# Patient Record
Sex: Female | Born: 1975 | Race: White | Hispanic: No | Marital: Single | Smoking: Former smoker
Health system: Southern US, Community
[De-identification: ages and names within clinical notes are randomized; demographics above are authoritative.]

## PROBLEM LIST (undated history)

## (undated) DIAGNOSIS — C801 Malignant (primary) neoplasm, unspecified: Secondary | ICD-10-CM

## (undated) DIAGNOSIS — E8801 Alpha-1-antitrypsin deficiency: Secondary | ICD-10-CM

## (undated) DIAGNOSIS — M549 Dorsalgia, unspecified: Secondary | ICD-10-CM

## (undated) DIAGNOSIS — J449 Chronic obstructive pulmonary disease, unspecified: Secondary | ICD-10-CM

## (undated) HISTORY — PX: SHOULDER SURGERY: SHX246

## (undated) HISTORY — PX: TUBAL LIGATION: SHX77

## (undated) HISTORY — PX: LUNG BIOPSY: SHX232

---

## 1998-11-04 ENCOUNTER — Emergency Department (HOSPITAL_COMMUNITY): Admission: EM | Admit: 1998-11-04 | Discharge: 1998-11-04 | Payer: Self-pay | Admitting: Emergency Medicine

## 1999-01-24 ENCOUNTER — Encounter: Payer: Self-pay | Admitting: Thoracic Surgery (Cardiothoracic Vascular Surgery)

## 1999-01-24 ENCOUNTER — Inpatient Hospital Stay (HOSPITAL_COMMUNITY): Admission: EM | Admit: 1999-01-24 | Discharge: 1999-01-27 | Payer: Self-pay | Admitting: Emergency Medicine

## 1999-01-24 ENCOUNTER — Encounter: Payer: Self-pay | Admitting: Emergency Medicine

## 1999-01-25 ENCOUNTER — Encounter: Payer: Self-pay | Admitting: Thoracic Surgery (Cardiothoracic Vascular Surgery)

## 1999-01-27 ENCOUNTER — Encounter: Payer: Self-pay | Admitting: Thoracic Surgery (Cardiothoracic Vascular Surgery)

## 1999-05-07 ENCOUNTER — Emergency Department (HOSPITAL_COMMUNITY): Admission: EM | Admit: 1999-05-07 | Discharge: 1999-05-07 | Payer: Self-pay | Admitting: Emergency Medicine

## 1999-05-07 ENCOUNTER — Encounter: Payer: Self-pay | Admitting: Emergency Medicine

## 1999-08-28 ENCOUNTER — Inpatient Hospital Stay: Admission: EM | Admit: 1999-08-28 | Discharge: 1999-08-28 | Payer: Self-pay | Admitting: Obstetrics & Gynecology

## 2000-01-18 ENCOUNTER — Emergency Department (HOSPITAL_COMMUNITY): Admission: EM | Admit: 2000-01-18 | Discharge: 2000-01-18 | Payer: Self-pay | Admitting: Emergency Medicine

## 2000-01-18 ENCOUNTER — Encounter: Payer: Self-pay | Admitting: Emergency Medicine

## 2006-11-13 ENCOUNTER — Other Ambulatory Visit: Admission: RE | Admit: 2006-11-13 | Discharge: 2006-11-13 | Payer: Self-pay | Admitting: Obstetrics and Gynecology

## 2007-03-20 ENCOUNTER — Ambulatory Visit (HOSPITAL_COMMUNITY): Admission: RE | Admit: 2007-03-20 | Discharge: 2007-03-20 | Payer: Self-pay | Admitting: Obstetrics and Gynecology

## 2007-03-24 ENCOUNTER — Encounter (INDEPENDENT_AMBULATORY_CARE_PROVIDER_SITE_OTHER): Payer: Self-pay | Admitting: Obstetrics and Gynecology

## 2007-03-24 ENCOUNTER — Inpatient Hospital Stay (HOSPITAL_COMMUNITY): Admission: RE | Admit: 2007-03-24 | Discharge: 2007-03-27 | Payer: Self-pay | Admitting: Obstetrics and Gynecology

## 2007-05-05 ENCOUNTER — Other Ambulatory Visit: Admission: RE | Admit: 2007-05-05 | Discharge: 2007-05-05 | Payer: Self-pay | Admitting: Obstetrics and Gynecology

## 2008-10-15 ENCOUNTER — Encounter: Payer: Self-pay | Admitting: Critical Care Medicine

## 2008-10-18 DIAGNOSIS — J939 Pneumothorax, unspecified: Secondary | ICD-10-CM | POA: Insufficient documentation

## 2008-10-18 DIAGNOSIS — Z9889 Other specified postprocedural states: Secondary | ICD-10-CM | POA: Insufficient documentation

## 2008-10-18 DIAGNOSIS — J930 Spontaneous tension pneumothorax: Secondary | ICD-10-CM | POA: Insufficient documentation

## 2008-10-19 ENCOUNTER — Ambulatory Visit: Payer: Self-pay | Admitting: Critical Care Medicine

## 2008-10-19 ENCOUNTER — Encounter: Payer: Self-pay | Admitting: Critical Care Medicine

## 2008-10-20 ENCOUNTER — Encounter: Payer: Self-pay | Admitting: Obstetrics and Gynecology

## 2008-10-20 ENCOUNTER — Ambulatory Visit: Payer: Self-pay | Admitting: Thoracic Surgery (Cardiothoracic Vascular Surgery)

## 2008-10-20 ENCOUNTER — Encounter
Admission: RE | Admit: 2008-10-20 | Discharge: 2008-10-20 | Payer: Self-pay | Admitting: Thoracic Surgery (Cardiothoracic Vascular Surgery)

## 2008-10-21 ENCOUNTER — Inpatient Hospital Stay (HOSPITAL_COMMUNITY): Admission: EM | Admit: 2008-10-21 | Discharge: 2008-10-27 | Payer: Self-pay | Admitting: Emergency Medicine

## 2008-10-21 ENCOUNTER — Encounter: Payer: Self-pay | Admitting: Critical Care Medicine

## 2008-10-21 ENCOUNTER — Ambulatory Visit: Payer: Self-pay | Admitting: Internal Medicine

## 2008-10-21 ENCOUNTER — Ambulatory Visit: Payer: Self-pay | Admitting: Thoracic Surgery

## 2008-10-27 ENCOUNTER — Telehealth (INDEPENDENT_AMBULATORY_CARE_PROVIDER_SITE_OTHER): Payer: Self-pay | Admitting: *Deleted

## 2008-11-03 ENCOUNTER — Encounter: Admission: RE | Admit: 2008-11-03 | Discharge: 2008-11-03 | Payer: Self-pay | Admitting: Thoracic Surgery

## 2008-11-03 ENCOUNTER — Ambulatory Visit: Payer: Self-pay | Admitting: Thoracic Surgery

## 2008-11-23 ENCOUNTER — Ambulatory Visit: Payer: Self-pay | Admitting: Thoracic Surgery

## 2008-11-23 ENCOUNTER — Encounter: Payer: Self-pay | Admitting: Critical Care Medicine

## 2008-11-26 ENCOUNTER — Ambulatory Visit: Payer: Self-pay | Admitting: Critical Care Medicine

## 2008-12-09 ENCOUNTER — Telehealth (INDEPENDENT_AMBULATORY_CARE_PROVIDER_SITE_OTHER): Payer: Self-pay | Admitting: *Deleted

## 2009-01-07 ENCOUNTER — Telehealth: Payer: Self-pay | Admitting: Critical Care Medicine

## 2009-03-02 ENCOUNTER — Telehealth (INDEPENDENT_AMBULATORY_CARE_PROVIDER_SITE_OTHER): Payer: Self-pay | Admitting: *Deleted

## 2009-04-12 ENCOUNTER — Ambulatory Visit: Payer: Self-pay | Admitting: Critical Care Medicine

## 2009-05-02 ENCOUNTER — Inpatient Hospital Stay (HOSPITAL_COMMUNITY): Admission: RE | Admit: 2009-05-02 | Discharge: 2009-05-04 | Payer: Self-pay | Admitting: Obstetrics and Gynecology

## 2009-05-02 ENCOUNTER — Encounter (INDEPENDENT_AMBULATORY_CARE_PROVIDER_SITE_OTHER): Payer: Self-pay | Admitting: Obstetrics and Gynecology

## 2009-12-12 ENCOUNTER — Emergency Department (HOSPITAL_COMMUNITY): Admission: EM | Admit: 2009-12-12 | Discharge: 2009-12-12 | Payer: Self-pay | Admitting: Emergency Medicine

## 2009-12-22 ENCOUNTER — Encounter: Admission: RE | Admit: 2009-12-22 | Discharge: 2009-12-22 | Payer: Self-pay | Admitting: Unknown Physician Specialty

## 2009-12-24 ENCOUNTER — Emergency Department (HOSPITAL_COMMUNITY): Admission: EM | Admit: 2009-12-24 | Discharge: 2009-12-25 | Payer: Self-pay | Admitting: Emergency Medicine

## 2009-12-28 ENCOUNTER — Encounter: Payer: Self-pay | Admitting: Adult Health

## 2009-12-28 ENCOUNTER — Ambulatory Visit: Payer: Self-pay | Admitting: Internal Medicine

## 2009-12-28 ENCOUNTER — Ambulatory Visit: Payer: Self-pay | Admitting: Critical Care Medicine

## 2010-01-06 ENCOUNTER — Ambulatory Visit: Payer: Self-pay | Admitting: Thoracic Surgery

## 2010-01-06 ENCOUNTER — Encounter: Admission: RE | Admit: 2010-01-06 | Discharge: 2010-01-06 | Payer: Self-pay | Admitting: Thoracic Surgery

## 2010-02-10 ENCOUNTER — Encounter: Admission: RE | Admit: 2010-02-10 | Discharge: 2010-02-10 | Payer: Self-pay | Admitting: Unknown Physician Specialty

## 2010-03-21 ENCOUNTER — Encounter: Admission: RE | Admit: 2010-03-21 | Discharge: 2010-03-21 | Payer: Self-pay | Admitting: Unknown Physician Specialty

## 2010-03-24 ENCOUNTER — Encounter: Admission: RE | Admit: 2010-03-24 | Discharge: 2010-03-24 | Payer: Self-pay | Admitting: Unknown Physician Specialty

## 2010-07-29 IMAGING — CR DG CHEST 2V
2 series · 2 of 2 positions shown · non-contrast
Comparison: 02/10/2010

CLINICAL DATA: Cough and shortness of breath.

CHEST - 2 VIEW

[view not recorded (1 of 2)]
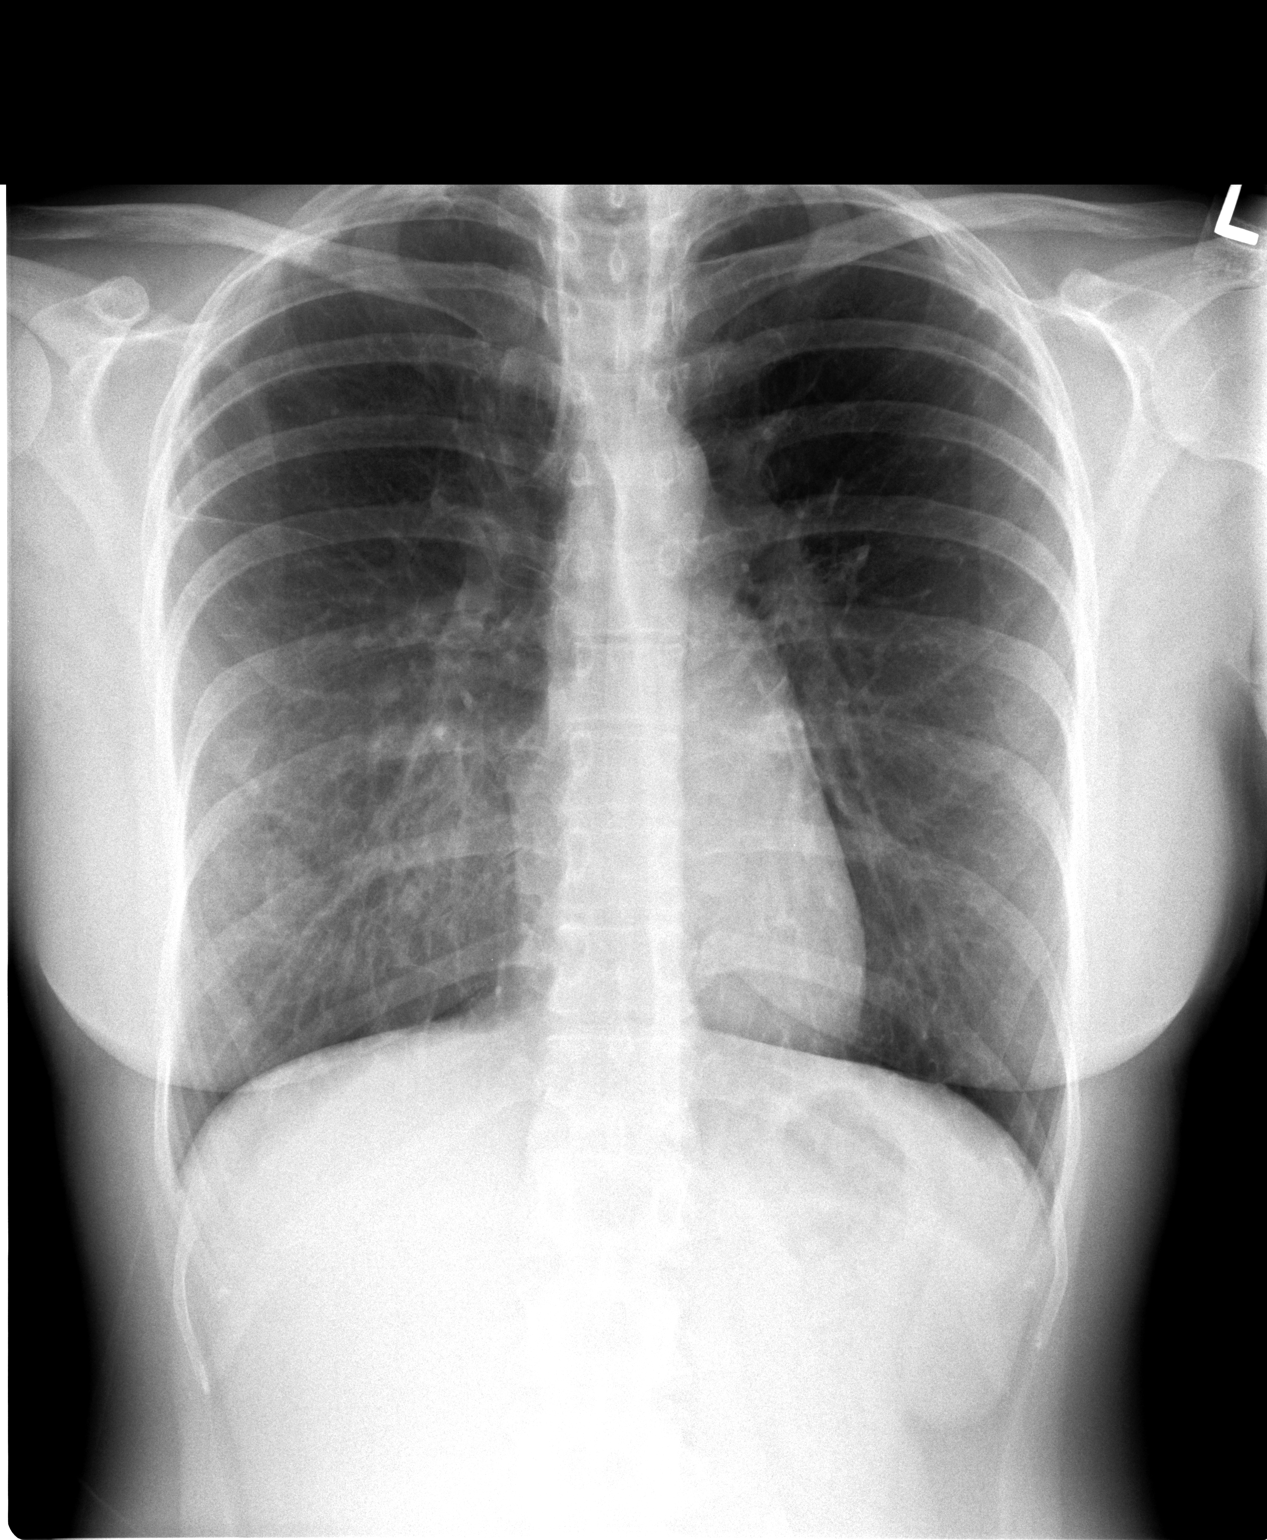

[view not recorded (2 of 2)]
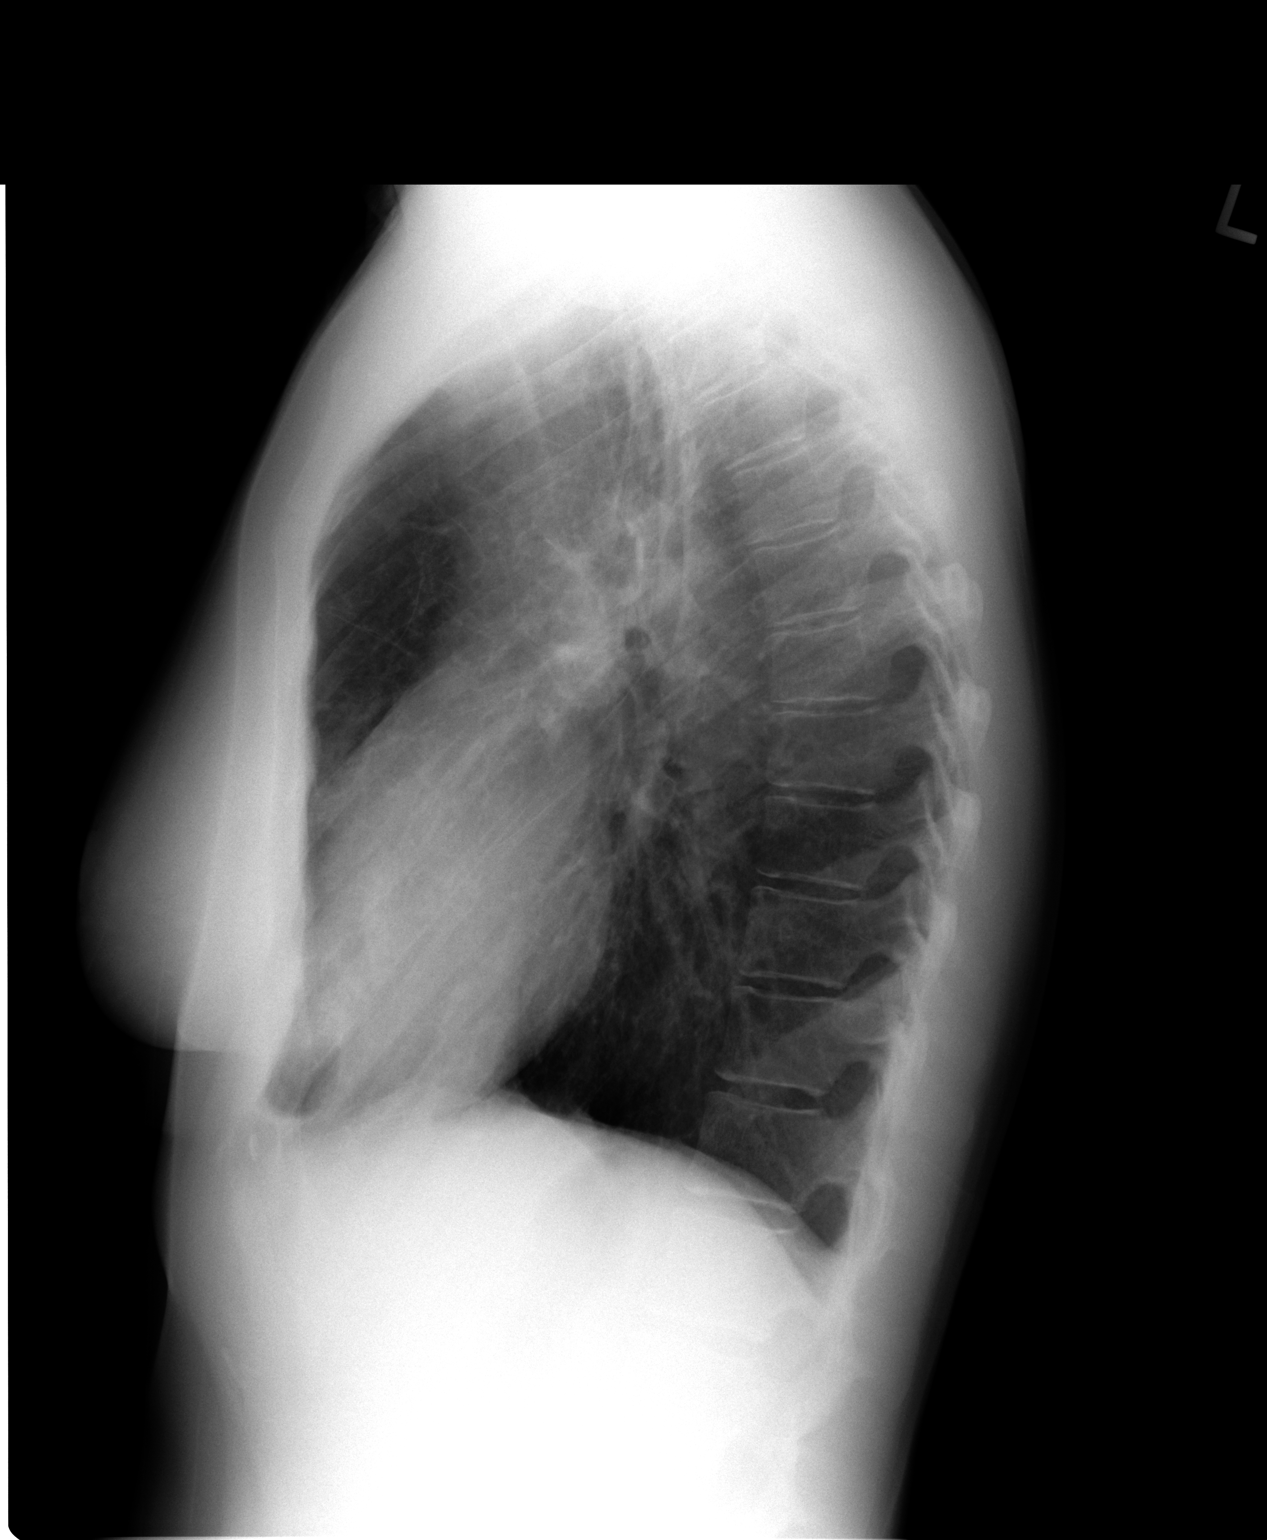

[2 of 2 positions shown; findings below may reference images not displayed]

FINDINGS: The cardiomediastinal silhouette is unremarkable.
Mild increased ill-defined opacity within the right middle lobe is
suspicious for early pneumonia.
Emphysema/COPD and mild biapical pleuroparenchymal scarring again
noted.
There is no evidence of pleural effusion or pneumothorax.
No acute bony abnormalities are identified.
IMPRESSION: Probable early right middle lobe pneumonia.

COPD/emphysema.

## 2010-10-15 ENCOUNTER — Encounter: Payer: Self-pay | Admitting: Thoracic Surgery (Cardiothoracic Vascular Surgery)

## 2010-10-15 ENCOUNTER — Encounter: Payer: Self-pay | Admitting: Obstetrics and Gynecology

## 2010-10-24 NOTE — Assessment & Plan Note (Signed)
Summary: hfu/ mbw   Copy to:  Dr. Joycelyn Rua Primary Provider/Referring Provider:  none  CC:  f/u after er visit at [facility] on 12-24-09 - pain in left chest  and shoulder and upper left back - Pt reports that cxr showed partial collapsed lung - occas SOB.  History of Present Illness: 35 years old female with Hx of spontaneous PTX.  April 12, 2009--no changes made at last ovNow in 3rd trimester of pregnancy.No issues with the baby.  SMokes about 2 cig/d.No dyspnea or current resp complaints.  12/28/09-Presents for ER follow up. Pt began having intermittent left upper chest wall pain on 3/31 -she was seen at PCP w/ xray that showed a small Pnuemothorax in left apex.. Her pain  worsened prompting her to go to ER on 4/2 , xray showed just slightly increased pneumo. She is unchanged today w/ left upper chest wall pain. She was given vicodin for pain and written out of work. She does continue to smoke. She denies increased dyspnea, cough. Denies orthopnea, hemoptysis, fever, n/v/d, edema, headache,recent travel, trauma.     Current Medications (verified): 1)  Tylenol Extra Strength 500 Mg Tabs (Acetaminophen) .... 2 By Mouth Daily As Needed Pain 2)  Vicodin 5-500 Mg Tabs (Hydrocodone-Acetaminophen) .... Take One By Mouth Every 6 Hours As Needed Pain 3)  Prednisone 10 Mg Tabs (Prednisone) .... 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,  now recurrent       -20% PTX 2010, left sided 12/2009 CRYOTHERAPY, CERVIX, HX OF (ICD-V45.89)  Review of Systems      See HPI  Vital Signs:  Patient profile:   35 year old female Weight:      120.50 pounds O2 Sat:      100 % on Room air Temp:     97.5 degrees F oral Pulse rate:   106 / minute BP sitting:   112 / 60  (left arm) Cuff size:   regular  Vitals Entered By: Abigail Miyamoto RN (December 28, 2009 12:00 PM)  O2 Flow:  Room air  Physical Exam  Additional Exam:  Gen: Pleasant, well nourished, in no distress ENT: no lesions, no postnasal drip Neck: No JVD, no TMG, no carotid bruits Lungs: no use of accessory muscles, no dullness to percussion, clear without rales or rhonchi Cardiovascular: RRR, heart sounds normal, no murmurs or gallops, no peripheral edema Musculoskeletal: No deformities, no cyanosis or clubbing     CXR  Procedure date:  12/28/2009  Findings:      Findings: Only a very small left pneumothorax is noted, now less than 5%.   There is chronic apical pleural thickening and mild elevation of the minor fissure.  No active cardiopulmonary disease.   IMPRESSION:   1.  Left apical pneumothorax now less than 5%. 2.  Chronic lung changes as above.  No acute findings are noted at this time.  Impression & Recommendations:  Problem # 1:  PNEUMOTHORAX (ICD-512.8) Left sided Pnumothorax xray reviewed on imagecast w/ noted decreased  size  ~<5%  Case discussed with Dr. Delford Field  She will be referred to Dr. Edwyna Shell for reevaluation of recurrent pnuemothorax.  once again advised on smoking cesstation.   Orders: T-2 View CXR (71020TC) Surgical Referral (Surgery) Est. Patient Level IV (60454)  Medications Added to Medication List This Visit: 1)  Vicodin 5-500 Mg Tabs (Hydrocodone-acetaminophen) .... Take one by mouth every 6 hours as needed pain 2)  Prednisone 10 Mg Tabs (Prednisone) .... Taper as directed  Complete Medication List: 1)  Tylenol Extra Strength 500 Mg Tabs  (Acetaminophen) .... 2 by mouth daily as needed pain 2)  Vicodin 5-500 Mg Tabs (Hydrocodone-acetaminophen) .... Take one by mouth every 6 hours as needed pain 3)  Prednisone 10 Mg Tabs (Prednisone) .... Taper as directed  Patient Instructions: 1)  MOST IMPORTANT IS TO QUIT SMOKING  2)  We are referring you to Dr. Edwyna Shell for evaluation of your recurrent pneumothorax.  3)  No heavy lifting or exercise until seen at Dr. Edwyna Shell office.  4)  Please contact office for sooner follow up if symptoms do not improve or worsen  5)  Follow up Dr Delford Field in 1 month

## 2010-10-24 NOTE — Letter (Signed)
Summary: Out of Work  [employer]  [address]   [place], [place] [address]   Phone: [phone]  Fax: [phone]    December 28, 2009   Employee:  [name]    To Whom It May Concern:   For Medical reasons, please excuse the above named employee from work for the following dates:  Start:   Thursday December 29, 2009  End:   may return to work Monday January 02, 2010  If you need additional information, please feel free to contact our office.         Sincerely,         Tammy Parrett, N.P.

## 2010-12-13 LAB — URINALYSIS, ROUTINE W REFLEX MICROSCOPIC
Bilirubin Urine: NEGATIVE
Hgb urine dipstick: NEGATIVE
Nitrite: NEGATIVE
Protein, ur: NEGATIVE mg/dL
Urobilinogen, UA: 0.2 mg/dL (ref 0.0–1.0)

## 2010-12-13 LAB — COMPREHENSIVE METABOLIC PANEL
ALT: 21 U/L (ref 0–35)
Alkaline Phosphatase: 77 U/L (ref 39–117)
BUN: 9 mg/dL (ref 6–23)
Chloride: 102 mEq/L (ref 96–112)
Glucose, Bld: 91 mg/dL (ref 70–99)
Potassium: 5 mEq/L (ref 3.5–5.1)
Sodium: 136 mEq/L (ref 135–145)
Total Bilirubin: 1.1 mg/dL (ref 0.3–1.2)
Total Protein: 7.6 g/dL (ref 6.0–8.3)

## 2010-12-13 LAB — CBC
HCT: 40.2 % (ref 36.0–46.0)
Hemoglobin: 13.5 g/dL (ref 12.0–15.0)
RBC: 4.48 MIL/uL (ref 3.87–5.11)
RDW: 12.9 % (ref 11.5–15.5)

## 2010-12-13 LAB — DIFFERENTIAL
Basophils Absolute: 0.1 10*3/uL (ref 0.0–0.1)
Basophils Relative: 1 % (ref 0–1)
Eosinophils Absolute: 0.4 10*3/uL (ref 0.0–0.7)
Monocytes Absolute: 1.2 10*3/uL — ABNORMAL HIGH (ref 0.1–1.0)
Monocytes Relative: 8 % (ref 3–12)
Neutro Abs: 7.2 10*3/uL (ref 1.7–7.7)
Neutrophils Relative %: 50 % (ref 43–77)

## 2010-12-13 LAB — URINE CULTURE

## 2010-12-13 LAB — RAPID URINE DRUG SCREEN, HOSP PERFORMED
Barbiturates: NOT DETECTED
Opiates: NOT DETECTED
Tetrahydrocannabinol: NOT DETECTED

## 2010-12-18 LAB — POCT I-STAT, CHEM 8
BUN: 13 mg/dL (ref 6–23)
Chloride: 105 mEq/L (ref 96–112)
Creatinine, Ser: 0.9 mg/dL (ref 0.4–1.2)
Glucose, Bld: 104 mg/dL — ABNORMAL HIGH (ref 70–99)
Potassium: 3.3 mEq/L — ABNORMAL LOW (ref 3.5–5.1)

## 2010-12-18 LAB — HEPATIC FUNCTION PANEL
ALT: 16 U/L (ref 0–35)
Albumin: 4.5 g/dL (ref 3.5–5.2)
Alkaline Phosphatase: 79 U/L (ref 39–117)
Indirect Bilirubin: 0.3 mg/dL (ref 0.3–0.9)
Total Protein: 8.7 g/dL — ABNORMAL HIGH (ref 6.0–8.3)

## 2010-12-18 LAB — DIFFERENTIAL
Eosinophils Relative: 1 % (ref 0–5)
Lymphocytes Relative: 14 % (ref 12–46)
Lymphs Abs: 1.9 10*3/uL (ref 0.7–4.0)

## 2010-12-18 LAB — CBC
HCT: 41.3 % (ref 36.0–46.0)
Hemoglobin: 14 g/dL (ref 12.0–15.0)
RDW: 12.8 % (ref 11.5–15.5)

## 2010-12-18 LAB — CK TOTAL AND CKMB (NOT AT ARMC): CK, MB: 1.3 ng/mL (ref 0.3–4.0)

## 2010-12-30 LAB — CBC
HCT: 35.5 % — ABNORMAL LOW (ref 36.0–46.0)
Hemoglobin: 10.1 g/dL — ABNORMAL LOW (ref 12.0–15.0)
MCHC: 35.2 g/dL (ref 30.0–36.0)
MCV: 93.4 fL (ref 78.0–100.0)
MCV: 94 fL (ref 78.0–100.0)
Platelets: 276 10*3/uL (ref 150–400)
RDW: 14.3 % (ref 11.5–15.5)
RDW: 14.3 % (ref 11.5–15.5)

## 2010-12-30 LAB — BASIC METABOLIC PANEL
BUN: 7 mg/dL (ref 6–23)
CO2: 21 mEq/L (ref 19–32)
Chloride: 104 mEq/L (ref 96–112)
Glucose, Bld: 90 mg/dL (ref 70–99)
Potassium: 4 mEq/L (ref 3.5–5.1)

## 2011-01-08 LAB — URINE CULTURE
Colony Count: NO GROWTH
Culture: NO GROWTH

## 2011-01-08 LAB — CBC
HCT: 33.7 % — ABNORMAL LOW (ref 36.0–46.0)
Hemoglobin: 11.6 g/dL — ABNORMAL LOW (ref 12.0–15.0)
MCV: 90.9 fL (ref 78.0–100.0)
RBC: 3.45 MIL/uL — ABNORMAL LOW (ref 3.87–5.11)
RBC: 3.7 MIL/uL — ABNORMAL LOW (ref 3.87–5.11)
RDW: 12.5 % (ref 11.5–15.5)
WBC: 8.1 10*3/uL (ref 4.0–10.5)

## 2011-01-08 LAB — LIPID PANEL
Triglycerides: 105 mg/dL (ref <150)
VLDL: 21 mg/dL (ref 0–40)

## 2011-01-08 LAB — DRUGS OF ABUSE SCREEN W/O ALC, ROUTINE URINE
Cocaine Metabolites: NEGATIVE
Creatinine,U: 47.3 mg/dL
Marijuana Metabolite: NEGATIVE
Methadone: NEGATIVE
Opiate Screen, Urine: NEGATIVE
Propoxyphene: NEGATIVE

## 2011-01-08 LAB — URINALYSIS, ROUTINE W REFLEX MICROSCOPIC
Bilirubin Urine: NEGATIVE
Glucose, UA: NEGATIVE mg/dL
Hgb urine dipstick: NEGATIVE
Ketones, ur: NEGATIVE mg/dL
Nitrite: NEGATIVE
Protein, ur: NEGATIVE mg/dL
Protein, ur: NEGATIVE mg/dL
Specific Gravity, Urine: 1.011 (ref 1.005–1.030)
Urobilinogen, UA: 0.2 mg/dL (ref 0.0–1.0)

## 2011-01-08 LAB — COMPREHENSIVE METABOLIC PANEL
ALT: 14 U/L (ref 0–35)
Alkaline Phosphatase: 88 U/L (ref 39–117)
BUN: 5 mg/dL — ABNORMAL LOW (ref 6–23)
CO2: 23 mEq/L (ref 19–32)
Chloride: 106 mEq/L (ref 96–112)
GFR calc non Af Amer: >60 mL/min (ref >60)
Glucose, Bld: 121 mg/dL — ABNORMAL HIGH (ref 70–99)
Potassium: 4.1 mEq/L (ref 3.5–5.1)
Sodium: 135 mEq/L (ref 135–145)
Total Bilirubin: 0.3 mg/dL (ref 0.3–1.2)
Total Protein: 6.3 g/dL (ref 6.0–8.3)

## 2011-01-08 LAB — HCG, QUANTITATIVE, PREGNANCY: hCG, Beta Chain, Quant, S: 51276 m[IU]/mL — ABNORMAL HIGH (ref <5)

## 2011-01-08 LAB — BASIC METABOLIC PANEL
Chloride: 106 mEq/L (ref 96–112)
Creatinine, Ser: 0.57 mg/dL (ref 0.4–1.2)
GFR calc Af Amer: >60 mL/min (ref >60)
Potassium: 3.9 mEq/L (ref 3.5–5.1)

## 2011-01-08 LAB — PROTIME-INR
INR: 1 (ref 0.00–1.49)
Prothrombin Time: 13.2 seconds (ref 11.6–15.2)

## 2011-01-08 LAB — TROPONIN I: Troponin I: <0.01 ng/mL (ref 0.00–0.06)

## 2011-01-08 LAB — POCT PREGNANCY, URINE: Preg Test, Ur: POSITIVE

## 2011-02-06 NOTE — Consult Note (Signed)
NEW PATIENT CONSULTATION   [name], [name]  DOB:  Feb 10, 1976                                        October 20, 2008  CHART #:  [id number]   REASON FOR CONSULTATION:  Left recurrent pneumothorax.   HISTORY OF PRESENT ILLNESS:  The patient is Owen 35 year old woman with the  history of tobacco abuse.  She is still smoking one pack daily.  She has  Owen history of Owen spontaneous pneumothorax in 2000.  Approximately Owen week  and half ago, the patient developed left upper chest pain.  Since she  has had Owen GI virus prior to that, but no other antecedent cause of the  pain.  She described this at first she thought she had Owen pulled muscle  in her shoulder and she just went along with this for Owen few days.  She  did have Owen little bit of shortness of breath initially, but was not  severe.  She then noted over the next few days that she was having more  shortness of breath and more pain and the pain just would not go away.  She subsequently went to the place where my sister works and had an x-  ray, which showed Owen left pneumothorax.  She was referred to Dr. Shan Levans who saw her yesterday.  Chest x-ray from yesterday showed Owen  superior and lateral pneumothorax.  There also was Owen basilar component  with air-fluid level at the base.  The majority of the one laterally was  to the chest wall.  On the lateral film, there does appear to be an  anterior component to the pneumothorax.  The patient was not terribly  symptomatic and she was advised to schedule to see me in the office  today with Owen repeat chest x-ray.  The patient went to [facility] for chest x-  ray and informed them that she was pregnant.  They refused to do the  film.  She was sent to the office.  She was sent back down after we  discussed with him repeating the x-ray with double shielding.  Apparently, there was some confusion and she was once again refused and  came back to the office.  The third attempt to get an  x-ray, the patient  refused to go down for the film.  She said that she is still having some  discomfort.  She does have Owen little shortness of breath with exertion,  but not at rest.  She states she has been primarily bothered because she  is having difficulty sleeping at night.   PAST MEDICAL HISTORY:  Significant for left spontaneous pneumothorax in  2000.  She had cryotherapy of her cervix.  She had C-sections in 2002  and 2008.   MEDICATIONS:  She is not currently on any medications other than Tylenol  p.r.n.   ALLERGIES:  She has no known drug allergies.   FAMILY HISTORY:  Significant for spontaneous pneumothorax in her mother.  She did not have Owen recurrence.   SOCIAL HISTORY:  She is single.  She has 1 child.  She is accompanied by  Owen female friend today.  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  She does smoke 1 pack per  day.   REVIEW OF SYSTEMS:  Negative other than what is noted in the HPI.  PHYSICAL EXAMINATION:  GENERAL:  The patient is Owen 35 year old female, in  no acute distress.  VITAL SIGNS:  Blood pressure is 118/66, pulse is 92, and respirations  16.  Her ox saturation is 99% on room air.  HEENT:  Unremarkable.  Trachea is midline.  CHEST:  There is no subcutaneous emphysema.  Breath sounds are  essentially equal bilaterally.  CARDIAC:  Regular rate and rhythm.  Normal S1 and S2.   Owen chest x-ray from yesterday is reviewed, it shows Owen small-to-moderate  size left pneumothorax, it maybe Owen more anterior component which is  difficult to discern, but the majority of the lung appears stuck to the  chest wall.  There was no subcutaneous emphysema.  There was no  mediastinal shift.   IMPRESSION:  The patient is Owen 35 year old woman with Owen recurrent  spontaneous pneumothorax.  We discussed management of pneumothorax, once  again reviewing that when she had her first pneumothorax, she was  approximately Owen 20% chance of recurrence, although with her continuing  to smoke, it was in  reality higher than that, but at this point, there  is Owen 50-50 chance of recurrence.  Importantly at this point in time, she  is minimally symptomatic and does not need chest tube placement.  The  difficulty in her situation arises with her pregnancy.  She states that  she has had Owen positive pregnancy test.  She has not seen Owen doctor to  have this dated.  But it would have to be assumed, since this is Owen  recent development, that this is likely Owen first trimester.  Apparently,  she has only missed one cycle.  The ideal approach at this point would  be to do Owen CT scan to assess the pulmonary anatomy and then proceed with  video-assisted thoracoscopic surgery blebectomy to prevent recurrence.  However, particularly with it being her first trimester pregnancy, I  would not recommend either Owen CT scan or surgical intervention for  nonemergent problems.  I did discuss with her that if she were to get  acutely short of breath, then she should go to the emergency room and if  need be Owen chest tube could be placed for short-term relief of the  problem.  I did have Owen long discussion with her that she should  discontinue smoking both in terms of risk of recurrent pneumothorax and  in regards to her early stage pregnancy.   The patient was unhappy with my opinion.  She did not agree with regards  to the risk of termination or birth defects with elective surgery during  the first trimester and discussed the alternative treatment plan, she  became upset, turned her back, and essentially would not continue to  discuss with me.  Her female friend had questions regarding what would  happen if she needed emergency surgery and of course, I discussed that  if she did need an emergency operation that would be carried out with  extremely high risk for complications related to her pregnancy, but this  is not an emergent situation.  If she were to develop acute shortness of  breath to the point that there was an  emergency to treat pneumothorax,  it can be treated under local anesthesia with Owen chest tube and with  definitive management done electively postpregnancy.  I recommended to  them that we check another film on her early next week.  She said that  she would elect not to come back next  week.  I then offered 2 weeks, she  said that she would not come back to that time either.  I did advise her  that she should follow up either with Dr. Delford Field or her medical doctor.  Her indication was that she would refuse to do either one of those as  well.  I did tell her that I could help arrange for Owen second opinion if  she so desired.  She did not wish for [place] to do that this time.  At the  patient's insistence, we will not plan followup despite my advice to her  that we should.   Salvatore Decent Dorris Fetch, M.D.  Electronically Signed   SCH/MEDQ  D:  10/20/2008  T:  10/20/2008  Job:  [id number]   cc:   Charlcie Cradle. Delford Field, MD, [facility]  Joycelyn Rua, M.D.

## 2011-02-06 NOTE — H&P (Signed)
[name], [name]           ACCOUNT NO.:  [id number]   MEDICAL RECORD NO.:  [id number]          PATIENT TYPE:  INP   LOCATION:  5158                         FACILITY:  [facility]   PHYSICIAN:  Carlena Hurl, MDDATE OF BIRTH:  06-27-1976   DATE OF ADMISSION:  10/21/2008  DATE OF DISCHARGE:                              HISTORY & PHYSICAL   CHIEF COMPLAINT:  Worsening shortness of breath and was transferred to  the ER because of the chest x-ray showing a right pneumothorax.   HISTORY OF PRESENT ILLNESS:  This is a 35 year old Caucasian female who  has a past medical history significant for spontaneous pneumothorax  happened in 2000, was transferred to the ER because of x-ray showing a  right pneumothorax.  The history was obtained from the patient.  According to her, she has a history of spontaneous pneumothorax, the  first episode happened in 2000.  She follows up with Dr. Charlett Lango as well as Dr. Delford Field.  She was seen by Dr. Dorris Fetch on  October 20, 2008, for the similar problem, and there she was offered  video-assisted thoracoscopic surgery lobectomy to prevent the  recurrence, and the patient refused to get any of the test done  according to the note from the Dr. Dorris Fetch.  On the next day, the  patient went to [facility] where she had chest x-ray again, which  showed new large right pneumothorax with complete collapse of the right  lung.  Because of that the patient was sent to this ER from the [facility].  When she came to the ER, a chest tube was placed and the  patient's shortness of breath gradually improved.  Right now, the  patient has a chest tube placed and her breathing improved, but she has  severe pain where the chest tube is placed.   PAST MEDICAL HISTORY:  Significant for spontaneous pneumothorax.   PAST SURGICAL HISTORY:  She had C-sections in 2002, 2008, and  cryotherapy of her cervix.   ALLERGIES:  No known drug  allergies.   MEDICATIONS:  The patient is not taking any medications.   FAMILY HISTORY:  Significant for spontaneous pneumothorax in her mother,  but she did not have any recurrence.   SOCIAL HISTORY:  The patient is single.  She has 1 child, and she works  as a Hospital doctor.  The patient is a smoker and still smokes about 1 per pack  per day.   REVIEW OF SYSTEMS:  Same as in HPI that is shortness of breath and right  shoulder pain.   PHYSICAL EXAMINATION:  GENERAL:  This is a 35 year old female who is  lying on the bed with discomfort of having a chest tube on the right  side, but no severe shortness of breath.  VITAL SIGNS:  When she came into the ER her blood pressure 108/67, pulse  rate of 101, respirations 20, temperature 98.6, and saturating 100% on  100% nonrebreather.  HEENT:  Head is atraumatic and normocephalic.  PERRLA.  Tympanic  membrane intact.  No discharge from eyes or ears.  NECK:  Supple.  No JVD appreciated.  LUNGS:  There is decreased air entry bilaterally, little more decreased  on the right side.  SKIN:  Crepitations are heard because of the chest tube placed on the  right side.  CVS:  S1 and S2 heard with regular rate and rhythm.  ABDOMEN:  Soft.  Bowel sounds present.  Nontender and nondistended.  EXTREMITIES:  No pedal edema noted.  Pulses palpable bilaterally.  No  cyanosis.  No clubbing noted.  CNS:  Awake, alert, and oriented x3.  No focal neurological deficits  noted.   LABORATORY DATA:  Urinalysis is pretty negative, and the patient has a  chest x-ray that was today around 12:45, which showed reexpansion of the  right lung after the chest tube was placed, but the left pneumothorax  15% is still unchanged.   ASSESSMENT AND PLAN:  This is a 35 year old Caucasian lady with a  significant history of spontaneous pneumothorax in 2000 and significant  family history in her mother of the same problem that is spontaneous  pneumothorax.  Now coming in with  total right lung collapse.  1. Recurrent spontaneous pneumothorax.  According to her, she has some      workup done as an outpatient and the patient is advised not to      smoke, and she was strongly advised about the side effects of      smoking as it could worsen her pneumothorax.  The patient is status      post chest tube at this time and once the patient's pneumothorax      completely resolves, we will remove chest tube and we will get the      old records to check for further etiology of her pneumothorax.  2. Pregnancy.  The patient says that she will be 10 weeks in about 3-4      days and the patient is advised side effects of smoking to the baby      as well.  3. Deep vein thrombosis prophylaxis.  The patient will be placed on      Lovenox 40 mg subcu 1 time a day.      Carlena Hurl, MD  Electronically Signed     JD/MEDQ  D:  10/21/2008  T:  10/21/2008  Job:  [id number]

## 2011-02-06 NOTE — Discharge Summary (Signed)
[name], [name]           ACCOUNT NO.:  [id number]   MEDICAL RECORD NO.:  [id number]          PATIENT TYPE:  INP   LOCATION:  9130                          [facility]ACILITY:  [facility]   PHYSICIAN:  Sherron Monday, MD        DATE OF BIRTH:  18-Sep-1976   DATE OF ADMISSION:  05/02/2009  DATE OF DISCHARGE:  05/04/2009                               DISCHARGE SUMMARY   ADMITTING DIAGNOSIS:  Intrauterine pregnancy at term with history of low  transverse cesarean section x2, undesired fertility.   DISCHARGE DIAGNOSES:  Intrauterine pregnancy at term with history of low  transverse cesarean section x2, undesired fertility, delivered.   PROCEDURE:  Status post repeat low transverse cesarean section as well  as bilateral tubal ligation.   For the complete history, please see the dictated note.  However, in  brief, 35 year old G4, P3 at 65 plus weeks for repeat low transverse  cesarean section and tubal ligation.  Her pregnancy was uncomplicated.  She does, however, have a history of spontaneous pneumothorax x2.  Her  cesarean section was performed on May 02, 2009, without difficulty.  She delivered a viable female infant at 9:47 a.m. with Apgars of 9 at 1  minute and 10 at 5 minutes and weight of 6 pounds 12 ounces.  Normal  uterus, tubes, and ovaries were noted.  Bilateral tubal segments were  sent to Pathology with EBL of approximately 500 mL.  Her postoperative  course was relatively uncomplicated.  She remained afebrile.  Vital  signs stable throughout.  On day of discharge, she was tolerating diet,  had passed gas, and voiding without difficulty, having normal lochia,  and pain was well controlled.  She desired discharge to home at this  time.  She was given routine discharge instructions and numbers to call  if any questions or problems.  She was also given prescriptions for  Motrin, Vicodin, and prenatal vitamins.  She will return to the office  with the baby's weight taken 1-2 days to get  her staples removed as  well.  She is breast-feeding.  Blood type is A positive.  She is rubella  immune.  She will use her tubal ligation for contraception and her  hemoglobin decreased from 12.4-10.1.      Sherron Monday, MD  Electronically Signed     JB/MEDQ  D:  05/04/2009  T:  05/04/2009  Job:  [id number]

## 2011-02-06 NOTE — H&P (Signed)
[name], [name] NO.:  [id number]   MEDICAL RECORD NO.:  [id number]          PATIENT TYPE:  INP   LOCATION:  9199                          FACILITY:  [facility]   PHYSICIAN:  Hannah Monday, MD        DATE OF BIRTH:  11-08-1975   DATE OF ADMISSION:  05/02/2009  DATE OF DISCHARGE:                              HISTORY & PHYSICAL   ADMISSION DIAGNOSES:  Intrauterine pregnancy at term with history of low  transverse cesarean section x2, undesired fertility.   PROCEDURE PLANNED:  Repeat low transverse cesarean section as well as  bilateral tubal ligation.   HISTORY OF PRESENT ILLNESS:  A 35 year old, G4, P3-0-0-3, at 25 plus  weeks for repeat low transverse cesarean section and tubal ligation.  Her pregnancy has been relatively uncomplicated except for a history of  spontaneous pneumothorax x2.  This was discussed at length with her  pulmonary medicine doctor and said that in her pregnancy she needed no  special care.  She was also some a late entry to care.   Past medical history is significant for pneumothorax x2 as well as  pulmonary blebs.   Past surgical history is significant for 2 low transverse cesarean  sections.   PAST GYNECOLOGIC HISTORY:  G1 was in 1995, 6-pound 9-ounce female infant,  vaginal delivery; G2 was in 2002, 5-pound 14-ounce female infant, C-  section secondary to failure to progress; G3 was in 2008, female infant 7  pounds, was a scheduled repeat cesarean section; G4 is the present  pregnancy.  She has a history of an abnormal Pap smear and a cryotherapy  in approximately 1997.   Medications include prenatal vitamins.   She has no known drug allergies.   SOCIAL HISTORY:  The patient admits to smoking.  No alcohol or drug use  in the pregnancy.   Family history is significant for her mother with hypertension,  grandfather with lung cancer, mother with diabetes.   PRENATAL LABORATORY DATA:  Hemoglobin 11.2, platelets 370,000.  RPR  nonreactive, hepatitis B surface antigen negative, rubella immune, HIV  negative.  Pap smear within normal limits.  She is A positive, antibody  screen negative, gonorrhea negative, chlamydia negative.  Cystic  fibrosis screen negative.  She was to advance to get prenatal diagnostic  screening.  One-hour Glucola of 102.  Group B strep was positive.  She  had an ultrasound at 19 weeks.  To date, her pregnancy with an [facility] of  May 07, 2009.  This ultrasound revealed normal anatomy, anterior  placenta, and a female infant.   PHYSICAL EXAMINATION:  VITAL SIGNS:  She is afebrile.  Vital signs  stable.  GENERAL:  No apparent distress.  CARDIOVASCULAR:  Regular rate and rhythm.  LUNGS:  Clear to auscultation bilaterally.  ABDOMEN:  Soft.  Fundus nontender.  EXTREMITIES:  Symmetric and nontender.  Fetal heart tones are 150s in  the office.  CERVIX:  She is 2 cm dilated, 70% effaced, and 2- station.   ASSESSMENT AND PLAN:  A 35 year old, G4, P3-0-0-3, at 39 plus weeks for  repeat low transverse cesarean section.  Discussed  with the patient  risks, benefits, and alternatives of the cesarean section as well as the  bilateral tubal ligation.  She voices understanding to all this and will  proceed on Owen, May 02, 2009.  t      Hannah Monday, MD  Electronically Signed     JB/MEDQ  D:  04/29/2009  T:  04/30/2009  Job:  [id number]

## 2011-02-06 NOTE — Op Note (Signed)
NAMEJAKYRAH, [name]           ACCOUNT NO.:  [id number]   MEDICAL RECORD NO.:  [id number]          PATIENT TYPE:  INP   LOCATION:  9120                          FACILITY:  [facility]   PHYSICIAN:  James A. Ashley Royalty, M.D.DATE OF BIRTH:  [birth year]   DATE OF PROCEDURE:  03/24/2007  DATE OF DISCHARGE:                               OPERATIVE REPORT   PREOPERATIVE DIAGNOSES:  1. Intrauterine pregnancy at 25 weeks' gestation.  2. Previous cesarean section.  3. Two-vessel umbilical cord.   POSTOPERATIVE DIAGNOSES:  1. Intrauterine pregnancy at 57 weeks' gestation.  2. Previous cesarean section.  3. Two-vessel umbilical cord.   PATHOLOGY:  Pending.   PROCEDURE:  Repeat low transverse cesarean section.   SURGEON:  Rudy Jew. Ashley Royalty, M.D.   ANESTHESIA:  Spinal.   FINDINGS:  A 7-pound 5-ounce female, Apgars 8 at one minute and 9 at five  5 minutes, sent to newborn nursery.   ESTIMATED BLOOD LOSS:  500 mL.   COMPLICATIONS:  None.   PACKS AND DRAINS:  Foley.   Sponge, needle, and instrument count reported as correct x2.   PROCEDURE:  The patient was taken to the operating room, placed in the  sitting position.  After spinal anesthetic was administered, she was  placed in the dorsal supine position and prepped and draped in usual  manner for abdominal surgery.  Foley catheter was placed.  A  Pfannenstiel incision was made down to level of the fascia.  The fascia  was nicked with a knife, incised transversely with Mayo scissors.  The  underlying rectus muscles were separated from the fascia using sharp and  blunt dissection.  Rectus muscles were separated in the midline,  exposing the peritoneum which was elevated with hemostats and entered  atraumatically with Metzenbaum scissors.  The incision was extended  longitudinally.  The uterus was then entered through a low transverse  incision using sharp and blunt dissection.  The fluid was noted to be  clear.  The infant was delivered from  a vertex presentation in an  atraumatic manner.  The infant was suctioned.  Cord was doubly clamped,  cut, and the infant given immediately to the awaiting pediatrics team.  The placenta was removed in its entirety and submitted to pathology for  histologic studies.  Uterus was exteriorized.  Uterus was then closed in  2 running layers of #1 Vicryl.  The first was a running locking layer.  The second was a running, intermittently locking, and imbricating layer.  Hemostasis was noted.  Next, the uterus, tubes and ovaries were  inspected and found to be normal and returned to the abdominal cavity.  Copious irrigation was accomplished.  Hemostasis was noted.  The  peritoneum was then closed with 3-0 Vicryl in a running fashion.  The  fascia was closed with 0 Vicryl in a running fashion.  The skin was  closed with staples.   The patient tolerated the procedure extremely well and was returned to  the recovery room in good condition.      James A. Ashley Royalty, M.D.  Electronically Signed     JAM/MEDQ  D:  03/24/2007  T:  03/24/2007  Job:  [id number]

## 2011-02-06 NOTE — Letter (Signed)
November 23, 2008   Charlcie Cradle. Delford Field, MD, FCCP  [address]  [place], [place] [address]   Re:  [name], [name]                 DOB:  Jun 18, 1976   Dear Dennie Bible:   I saw the patient back today.  Her incisions are well healed.  Her blood  pressure was 110/69, pulse 100, and sats were 99%.  Lungs were clear to  auscultation and percussion.  No pain and no evidence of recurrence of  her pneumothoraces.  We did not get a chest x-ray today because we want  a limited amount of radiation that she would have, but she knows that we  will be happy to see her again should she develop any further symptoms  either now or during her pregnancy.   Sincerely,   Ines Bloomer, M.D.  Electronically Signed   DPB/MEDQ  D:  11/23/2008  T:  11/23/2008  Job:  [id number]

## 2011-02-06 NOTE — Consult Note (Signed)
NAMEEMILYROSE, [name] NO.:  [id number]   MEDICAL RECORD NO.:  [id number]          PATIENT TYPE:  INP   LOCATION:  5158                         FACILITY:  [facility]   PHYSICIAN:  Nelda Bucks, MD DATE OF BIRTH:  01-Feb-1976   DATE OF CONSULTATION:  DATE OF DISCHARGE:                                 CONSULTATION   This is a 35 year old white female with history of smoking one pack per  day, prior spontaneous pneumothorax in 2000 and developed chest pain  approximately 1 week ago with progressive nature.  The chest pain was  diffuse anteriorly located.  The patient was seen by Dr. Delford Field on  October 19, 2008, secondary to the chest pain and pneumothorax noted in  the left anterior apical lung field with possible loculation.  This  patient was seen by Dr. Delford Field and referred to Dr. Dorris Fetch for  further assessments.  Dr. Dorris Fetch evaluated the patient and noted  the patient to have a positive beta hCG consistent with probable  pregnancy.  Repeat chest x-rays were found to again have an unchanged  left anterior apical loculated pneumothorax.  The patient was counseled  by Dr. Dorris Fetch given the pregnancy status to avoid any kind of  surgical intervention at this time.  The patient went home and developed  significant increase in shortness of breath and pain on the right hand  side, came to the emergency room at the [facility] and was sent  over to [facility] secondary to a large right-sided pneumothorax with  right total lung collapse.  Chest tube was placed by the ER physician.  The patient was then admitted to the [facility] on suction with  chest tube therapy.  We are now called for consultation.   ALLERGIES:  No known drug allergies.   PAST MEDICAL HISTORY AND PAST SURGICAL HISTORY:  1. Spontaneous pneumothorax in 2000 on the left side.  2. Cryotherapy of her cervix.  3. C-section in 2002 and 2008 with a vaginal delivery back in 1995,    she has 3 children.   MEDICATIONS:  Tylenol p.r.n. as an outpatient.   FAMILY HISTORY:  Positive for mother with spontaneous pneumothorax as  well.   SOCIAL HISTORY:  She is a smoker of one pack per day.  She does not use  illicit drugs.  She has got 3 children.  Her occupation is a Hospital doctor, and  she currently has a fiance.   REVIEW OF SYSTEMS:  Positive for weakness and being tired over the past  10 weeks since November.   PHYSICAL EXAMINATION:  VITAL SIGNS:  Temperature 97.7, heart rate 99,  respiratory rate normal 18, blood pressure 120/79, and 98% saturation on  room air.  GENERAL:  The patient is awake, in no distress with no crepitus on skin  and the neck.  LUNGS:  Decreased breath sounds on the right with coarse __________  during inspiration with left-sided breath sounds to be clear to  auscultation.  HEART:  S1, S2.  Regular rate and rhythm.  ABDOMEN:  Belly is soft.  Bowel sounds are present.  Nontender,  nondistended with no palpable uterus.  EXTREMITIES:  No edema.  CENTRAL NERVOUS SYSTEM:  She is alert and oriented x3.  There is no  other findings on finger-to-nose.   LABORATORY DATA:  Chest x-ray which shows a placed right-sided chest  tube with a resolved pneumothorax.  In general, although there is  residual right apical pneumothoraces noted to be less than 10% with  unchanged left anterior apical loculated hydropneumothorax.  White count  was elevated at 16.3, hemoglobin stable at 11.6, hematocrit 33.7,  platelet count 331.  Coags are within normal limits.  Chemistry panel is  within normal limits, with creatinine noted to be 0.62.  Urinalysis is  negative for infection.  Urine culture is pending.  Albumin is 3.3.  LFTs are negative.  Troponin negative.  Beta hCG and urine is positive.   ASSESSMENT:  1. Spontaneous right-sided large pneumothorax status post chest tube      placement.  2. Left-sided small apical anterior pneumothorax, hydropneumothorax,       loculation.  3. Probable pregnancy.  4. Leukocytosis.   PLAN:  This patient currently has significant leak noted still with her  chest tube.  We will increase the threshold from 20-25 cm.  Repeat  __________portable chest x-ray in the morning time.  Please note I have  written order that this patient is pregnant, so all x-rays will be done  with significant caution with double protection.  I have consulted Dr.  Edwyna Shell and described the patient's circumstances.  He will be seeing the  patient shortly, and he did note that the patient may require CAT scan  to assess her other lung abnormalities on chest x-ray as well as  location of the pneumothorax and in the pregnancy state this may be of  concern, Dr. Edwyna Shell will make that decision when needed.  Portable chest  x-ray will be done in the morning time with suction increase.  I will  discontinued Dilaudid as this is a class C agent for pregnancy as well  as morphine, and we will continue using Tylenol and I have counseled the  patient on importance of trying to avoid other agents.  I also will  continue Lovenox for this patient as this is a class B drug and her risk  for DVT is very high.  It seems now that she is not ambulating, and she  is pregnant.  I spoke with Dr. Thad Ranger who is a OB/GYN doctor from  [facility].  We spoke at length of her current circumstances.  She  agreed with and recommended transvaginal abdominal ultrasound to check  the viability of the fetus as well as what I have already written for  which is beta hCG quantitative as well starting prenatal vitamins.  They  do not have privilege at [facility] and did not need to think  in general that this patient even needed to be assessed at bedside given  her current status, but did offer to call [phone] for any further  questions and concerns throughout her hospitalization.  I did make it  clear this patient may be in the hospital for over a week given her   current circumstances or longer.  We will follow her white count in the  morning time, there is most likely demargination response secondary to  her pneumothorax.  She will receive the remainder of prenatal care as an  outpatient regarding further work up.  Also, we will trend her beta hCG  at 48  hours to see if it doubles, again give credence to viability of  the fetus.      Nelda Bucks, MD  Electronically Signed     DJF/MEDQ  D:  10/21/2008  T:  10/22/2008  Job:  [id number]   cc:   Threasa Heads, M.D.

## 2011-02-06 NOTE — Letter (Signed)
November 03, 2008   Charlcie Cradle. Delford Field, MD, FCCP  [address]  [place], [place] [address]   Re:  [name], [name]                 DOB:  05-May-1976   Dear Dennie Bible:   I saw the patient back today and her chest x-ray showed complete  resolution of her pneumothoraces.  She is doing well overall.  I removed  her chest tube sutures and we will see her back again in 3 weeks.  Her  blood pressure was 112/72, pulse 100, respirations 18, and sats were  94%.   Sincerely,   Ines Bloomer, M.D.  Electronically Signed   DPB/MEDQ  D:  11/03/2008  T:  11/03/2008  Job:  [id number]

## 2011-02-06 NOTE — Op Note (Signed)
NAMECASSITY, [name]           ACCOUNT NO.:  [id number]   MEDICAL RECORD NO.:  [id number]          PATIENT TYPE:  INP   LOCATION:  9130                          [facility]   PHYSICIAN:  Sherron Monday, MD        DATE OF BIRTH:  [birth year]   DATE OF PROCEDURE:  05/02/2009  DATE OF DISCHARGE:                               OPERATIVE REPORT   PREOPERATIVE DIAGNOSES:  History of low-transverse cesarean section x2,  undesired fertility, intrauterine pregnancy at term.   POSTOPERATIVE DIAGNOSES:  History of low-transverse cesarean section x2,  undesired fertility, intrauterine pregnancy at term, delivered.   PROCEDURES:  Repeat low transverse cesarean section, bilateral tubal  ligation by Pomeroy method.   SURGEON:  Sherron Monday, MD   ASSISTANT:  Huel Cote, MD   ANESTHESIA:  Spinal.   FINDINGS:  Viable female infant at 9:47 a.m. with Apgars of 9 at 1  minute, 10 at 5 minutes and a weight of 6 pounds 12 ounces.  Normal  uterus, tubes and ovaries noted.   SPECIMEN:  Tubal segments were sent to Pathology.   COMPLICATIONS:  None.   ESTIMATED BLOOD LOSS:  500 mL.   IV FLUIDS:  2300 mL.   URINE OUTPUT:  200 mL.   DISPOSITION:  Stable to PACU following the procedure.   PROCEDURE IN DETAILS:  After informed consent was reviewed with the  patient including the risks, benefits and alternatives of both the  surgical procedure and the tubal ligation including bleeding, infection,  damage to surrounding organs as well as less than 1/200 risk of failure  of the tubal ligation with increased risk of ectopic push, the patient  voiced understanding, proceeded to the suite where spinal anesthesia was  induced and found be adequate.  She was then placed in supine position  with a leftward tilt.  She was sterilely prepped, and the catheter was  sterilely placed.  She was then draped in the normal sterile fashion.  At the level of her previous Pfannenstiel incision, incision was  made,  carried through the underlying layer of fascia sharply.  The fascia was  incised in the midline, the incision was extended laterally with Mayo  scissors.  The inferior aspect of the incision was grasped with Kocher  clamps, elevating the rectus muscles were dissected off both bluntly and  sharply.  Attention was then turned to the superior aspect of the  incision, which in a similar fashion where the fascia was tented up with  the Kocher clamps and the rectus muscles were dissected off both bluntly  and sharply, and peritoneum was entered bluntly and this incision was  extended superiorly and inferiorly with good visualization of bladder.  The Alexis skin retractor was placed carefully checking to make sure no  bowel was entrapped.  The vesicouterine peritoneum was identified,  grasped with smooth pickups and the bladder flap was created both  digitally and sharply.  A uterine window was noted.  The uterine  incision was performed transversely and the infant was delivered from a  vertex presentation and suctioned on the field and handed off to  the  awaiting pediatric staff.  After the cord was clamped and cut, the  placenta was then expressed from the uterus.  The uterus was cleared of  all clots and debris.  The uterine incision was closed with 2 layers of  0 Monocryl, first of which is a running locked and the second as an  imbricating.  Hemostasis was assured at the incision.  Copious pelvic  irrigation was performed, bilateral tubal ligation was performed.  A  window was created in the mesosalpinx and approximately 2-cm portion of  the tube was then excised in a Pomeroy fashion bilaterally.  Hemostasis  was assured.  The tubal segments were sent to Pathology.  The peritoneum  was then reapproximated with 2-0 Vicryl.  The fascia was closed with 0  Vicryl in a running fashion.  The subcuticular adipose layer was then  reapproximated using 2-0 plain gut.  Skin was closed with  staples.  The  patient tolerated the procedure well.  Sponge, lap and needle counts  were correct x2 at the end of the procedure.  The patient was taken to  the PACU in stable condition.      Sherron Monday, MD  Electronically Signed     JB/MEDQ  D:  05/02/2009  T:  05/03/2009  Job:  [id number]

## 2011-02-06 NOTE — Letter (Signed)
January 06, 2010   Charlcie Cradle. Delford Field, MD, FCCP  [address]  [place], [place] [address]   Re:  [name], [name]                   DOB:  Jul 21, 1976   Dear Dennie Bible,   I saw the patient back today.  She was referred by your doctor.  Apparently on December 12, 2009, she went to the emergency room with chest  pain and had a CT scan that revealed a small lingular pneumonia.  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  She was told that she had pneumonia with the airspace  consolidation in the lingula.  She remembers she previously had a VATS  procedure by Dr. Cornelius Moras in 2000 on the left side.  Because the chest pain  contained, she went back to the emergency room on December 22, 2009, and at  that time had a 5% to 10% pneumothorax.  Followup chest x-rays on December 24, 2009, were about the same and it was less than 5%.  Her x-ray today  was no pneumothorax.  I think she is in the process of trying to quit  smoking and is on Chantix.  I explained to her that the pneumothorax has  resolved, although she had a small pneumothorax.  Her lung would not  collapse because of her previous pleurodesis.  When I tried to explain  to her that it was very important that she stop smoking because of her  multiple emphysematous blebs, she became very defensive.  We tried to  show her the x-rays so that she would believe this, but she became more  defensive and left our office.  I was trying to get a point across to  her that if she continues to smoke because of her lung capacity is  already on the way to early emphasis blebs, then we will probably  continue on to vanishing lung disease and marked pulmonary impairment at  an early age.  This is something as you are sure you are well aware of  that we see over and over again.  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  I will be happy to discuss this with her at any time.  Her  blood pressure was 118/81, pulse 100, respirations 16, sats were 97%.  Lungs were clear to auscultation and percussion.   Ines Bloomer, M.D.  Electronically Signed   DPB/MEDQ  D:  01/06/2010  T:  01/07/2010  Job:  [id number]   cc:   Joycelyn Rua, M.D.

## 2011-02-06 NOTE — Discharge Summary (Signed)
Hannah, Owen NO.:  [id number]   MEDICAL RECORD NO.:  [id number]          PATIENT TYPE:  INP   LOCATION:  5158                         FACILITY:  [facility]   PHYSICIAN:  Charlcie Cradle. Delford Field, MD, FCCPDATE OF BIRTH:  02-03-1976   DATE OF ADMISSION:  10/21/2008  DATE OF DISCHARGE:  10/27/2008                               DISCHARGE SUMMARY   DISCHARGE DIAGNOSES:  1. Recurrent pneumothorax, bilateral, right greater than left.  2. Long term smoking history.  3. Pregnancy.   HISTORY OF PRESENT ILLNESS:  [name] is a 35 year old  white female who has a history of pneumothoraces.  She presented with  her first pneumothorax in 2000.  She presented to the emergency room  with increasing shortness of breath and was noted to have a right  pneumothorax.  This has been evaluated by Dr. Dorris Fetch on October 20, 2008 for similar problems.  She was offered video-assisted thoracic  surgery nondistended lobectomy to prevent recurrence, but the patient  refused getting the test done according to the note from Dr.  Dorris Fetch.  Of note, she is now pregnant.  She presented to the  emergency room with increasing shortness of breath and chest tube was  placed, presumably by the emergency department physician in the right  chest.  She was admitted for further evaluation to the pulmonary  critical care service with a CVTS consult.   LABORATORY DATA:  Recurrent chest x-ray on the day of discharge,  October 27, 2008, shows bilateral small echo pneumothoraces but no  recurrence of large right pneumothoraces.  Hemoglobin 10.5, hematocrit  31.3, white blood cell count 8.1, platelet count 283,000.  Sodium 134,  potassium 3.9, chloride 106, CO2 22, BUN 4, creatinine 0.57, glucose 86.  Albumin is 3.3.  HDL is 40  Cholesterol is 045.  Triglycerides 150.  Troponin-I is less than 0.01.  CK is 97, CK-MB is 0.9.  Calcium is 8.1.  Urine culture is unremarkable.  Alpha-I  antitrypsin is unremarkable.  Pregnancy test is positive.   HOSPITAL COURSE BY DISCHARGE DIAGNOSIS:  Recurrent bilateral  pneumothoraces, right greater than left.  She was transferred from the  emergency room where she had a right chest tube placed with resolution  of right pneumothorax but continuation of small left apical  pneumothorax.  She was evaluated by cardiovascular thoracic surgery.  Her chest tube was subsequently removed on October 25, 2008 and x-rays  revealed no recurrence of large right pneumothorax.  She is to have a  followup appointment with thoracic surgery.  They will call and make an  appointment with her in one week with a PA and lateral chest x-ray.  She  has a followup appointment with Dr. Shan Levans, Pulmonary Division,  on November 26, 2008 at 9:30 A.Lesle Chris MEDICATIONS:  1. Vicodin 1 to 2 tablets every 4 hours p.r.n. for pain.  2. Nicotrol inhaler 6 to 8 cartridges per day.   SPECIAL INSTRUCTIONS:  No smoking.   WOUND CARE:  She has her right chest tube dressing and she can change  dressing was needed.   FOLLOWUP:  She is to followup with cardiothoracic surgeon at which time  they can remove her sutures.  Cardiothoracic surgeons will call and make  an appointment in one week with a PA and lateral chest x-ray.   DIET:  Regular diet, as tolerated.   DISPOSITION/CONDITION ON DISCHARGE:  Her pneumothoraces are now just  small apical.  She will be evaluated as an outpatient by cardiovascular  thoracic surgery.      Devra Dopp, MSN, ACNP      Charlcie Cradle. Delford Field, MD, [facility]  Electronically Signed    SM/MEDQ  D:  10/27/2008  T:  10/27/2008  Job:  [id number]   cc:   Salvatore Decent. Dorris Fetch, M.D.  Ines Bloomer, M.D.  Carlena Hurl, MD

## 2011-02-09 NOTE — Discharge Summary (Signed)
[name], [name]           ACCOUNT NO.:  [id number]   MEDICAL RECORD NO.:  [id number]          PATIENT TYPE:  INP   LOCATION:  9120                          [facility]   PHYSICIAN:  James A. Ashley Royalty, M.D.DATE OF BIRTH:  1976/05/10   DATE OF ADMISSION:  03/24/2007  DATE OF DISCHARGE:  03/27/2007                               DISCHARGE SUMMARY   DISCHARGE DIAGNOSES:  Intrauterine pregnancy at [redacted] weeks gestation,  delivered.  1. Previous cesarean section.  2. Late presentation for care.  3. Two-vessel umbilical cord.   OPERATIONS AND PROCEDURES:  Repeat low transverse cesarean section.   CONSULTATIONS:  None.   DISCHARGE MEDICATIONS:  1. Percocet.  2. Motrin 600 mg q.i.d. p.r.n.  3. Prenatal vitamins.   HISTORY AND PHYSICAL:  This is a 35 year old gravida 3, para 2 at 60  weeks' gestation with the aforementioned risk factors.  She was admitted  for repeat cesarean section.  For the remainder of the history and  physical, please see chart.   HOSPITAL COURSE:  The patient was admitted [facility]  [place].  Admission laboratory studies were drawn.  On March 24, 2007,  she was taken to the operating room and underwent repeat low transverse  cesarean section per Dr. Sylvester Harder.  The procedure yielded a 7  pound 5 ounce female Apgars 8 at 1 minute and 9 at 5 minutes, sent to the  newborn nursery.  The patient's postoperative course was benign.  The  patient had an asymptomatic anemia which was treated with iron.  She was  felt to be stable for discharge on March 27, 2007, and was discharged  home afebrile and in satisfactory condition.   DISPOSITION:  The patient is to return to [facility] and  Obstetrics in approximately six weeks for postoperative evaluation.      James A. Ashley Royalty, M.D.  Electronically Signed     JAM/MEDQ  D:  05/29/2007  T:  05/29/2007  Job:  [id number]

## 2011-07-10 LAB — CBC
HCT: 28 — ABNORMAL LOW
Hemoglobin: 9.6 — ABNORMAL LOW
MCV: 92.2
MCV: 93.2
Platelets: 270
RBC: 2.81 — ABNORMAL LOW
RBC: 3.01 — ABNORMAL LOW
WBC: 10.5
WBC: 15.4 — ABNORMAL HIGH

## 2011-07-11 LAB — CBC
HCT: 32.4 — ABNORMAL LOW
MCHC: 34.3
MCV: 92.5
Platelets: 289
RDW: 14.1 — ABNORMAL HIGH

## 2017-10-21 ENCOUNTER — Ambulatory Visit (HOSPITAL_COMMUNITY)
Admission: EM | Admit: 2017-10-21 | Discharge: 2017-10-21 | Disposition: A | Discharge status: Home / Self Care | Payer: Medicaid Other | Attending: Family Medicine | Admitting: Family Medicine

## 2017-10-21 ENCOUNTER — Encounter (HOSPITAL_COMMUNITY): Payer: Self-pay | Admitting: Emergency Medicine

## 2017-10-21 DIAGNOSIS — Z3202 Encounter for pregnancy test, result negative: Secondary | ICD-10-CM

## 2017-10-21 DIAGNOSIS — R42 Dizziness and giddiness: Secondary | ICD-10-CM | POA: Diagnosis not present

## 2017-10-21 HISTORY — DX: Malignant (primary) neoplasm, unspecified: C80.1

## 2017-10-21 HISTORY — DX: Alpha-1-antitrypsin deficiency: E88.01

## 2017-10-21 LAB — POCT URINALYSIS DIP (DEVICE)
Bilirubin Urine: NEGATIVE
Glucose, UA: NEGATIVE mg/dL
Hgb urine dipstick: NEGATIVE
KETONES UR: NEGATIVE mg/dL
Leukocytes, UA: NEGATIVE
Nitrite: NEGATIVE
PH: 7 (ref 5.0–8.0)
PROTEIN: NEGATIVE mg/dL
SPECIFIC GRAVITY, URINE: 1.01 (ref 1.005–1.030)
Urobilinogen, UA: 0.2 mg/dL (ref 0.0–1.0)

## 2017-10-21 LAB — POCT I-STAT, CHEM 8
BUN: 6 mg/dL (ref 6–20)
CALCIUM ION: 1.15 mmol/L (ref 1.15–1.40)
CHLORIDE: 103 mmol/L (ref 101–111)
Creatinine, Ser: 0.9 mg/dL (ref 0.44–1.00)
GLUCOSE: 91 mg/dL (ref 65–99)
HCT: 43 % (ref 36.0–46.0)
Hemoglobin: 14.6 g/dL (ref 12.0–15.0)
Potassium: 3.9 mmol/L (ref 3.5–5.1)
Sodium: 140 mmol/L (ref 135–145)
TCO2: 24 mmol/L (ref 22–32)

## 2017-10-21 LAB — POCT PREGNANCY, URINE: Preg Test, Ur: NEGATIVE

## 2017-10-21 MED ORDER — MECLIZINE HCL 12.5 MG PO TABS: 12.5000 mg | ORAL_TABLET | Freq: Three times a day (TID) | ORAL | 0 refills | Status: DC | PRN

## 2017-10-21 NOTE — Discharge Instructions (Signed)
This appears to be an inner ear problem.  The meclizine is to help with the vertigo.  If symptoms continue, you will need an ENT evaluation (see listed specialist)

## 2017-10-21 NOTE — ED Triage Notes (Signed)
PT reports dizziness, lightheadedness, decreased concentration, nausea, light vomiting, abdominal pain, hip pain, lower back pain for 3 weeks.   PT has had irregular menstrual. Has had tubal, wonders if she is pregnant.

## 2017-10-21 NOTE — ED Notes (Signed)
Patient discharged by provider.

## 2017-10-21 NOTE — ED Provider Notes (Signed)
[place]   [id number] 10/21/17 Arrival Time: 9562   SUBJECTIVE:  [name] is a 42 y.o. female who presents to the urgent care with complaint of dizziness for 3 weeks.  No ear pain or loss of hearing.  Symptoms come and go.  Has had a couple vomiting episodes with cough, but denies much cough  No diarrhea, chest pain, abdominal pain  PMHx   Lung cancer surgery, no sign of return x over 5 years.  Past Medical History:  Diagnosis Date  . Alpha-1-antitrypsin deficiency ([place])   . Cancer [facility])    No family history on file. Social History   Socioeconomic History  . Marital status: Single    Spouse name: Not on file  . Number of children: Not on file  . Years of education: Not on file  . Highest education level: Not on file  Social Needs  . Financial resource strain: Not on file  . Food insecurity - worry: Not on file  . Food insecurity - inability: Not on file  . Transportation needs - medical: Not on file  . Transportation needs - non-medical: Not on file  Occupational History  . Not on file  Tobacco Use  . Smoking status: Never Smoker  . Smokeless tobacco: Never Used  Substance and Sexual Activity  . Alcohol use: No    Frequency: Never  . Drug use: No  . Sexual activity: Not on file  Other Topics Concern  . Not on file  Social History Narrative  . Not on file   No outpatient medications have been marked as taking for the 10/21/17 encounter [facility] Encounter).   No Known Allergies    ROS: As per HPI, remainder of ROS negative.   OBJECTIVE:   Vitals:   10/21/17 1635 10/21/17 1638  BP: 128/82   Pulse: (!) 117   Resp: 16   Temp: 98.7 F (37.1 C)   TempSrc: Temporal   SpO2: 100%   Weight:  126 lb (57.2 kg)     General appearance: alert; no distress Eyes: PERRL; EOMI; conjunctiva normal HENT: normocephalic; atraumatic; TMs normal, canal normal, external ears normal without trauma; nasal mucosa normal; oral mucosa normal Neck:  supple Lungs: clear to auscultation bilaterally Heart: regular rate and rhythm Abdomen: soft, non-tender; bowel sounds normal; no masses or organomegaly; no guarding or rebound tenderness Back: no CVA tenderness Extremities: no cyanosis or edema; symmetrical with no gross deformities Skin: warm and dry Neurologic: normal gait; grossly normal Psychological: alert and cooperative; normal mood and affect      Labs:  Results for orders placed or performed during the hospital encounter of 10/21/17  POCT urinalysis dip (device)  Result Value Ref Range   Glucose, UA NEGATIVE NEGATIVE mg/dL   Bilirubin Urine NEGATIVE NEGATIVE   Ketones, ur NEGATIVE NEGATIVE mg/dL   Specific Gravity, Urine 1.010 1.005 - 1.030   Hgb urine dipstick NEGATIVE NEGATIVE   pH 7.0 5.0 - 8.0   Protein, ur NEGATIVE NEGATIVE mg/dL   Urobilinogen, UA 0.2 0.0 - 1.0 mg/dL   Nitrite NEGATIVE NEGATIVE   Leukocytes, UA NEGATIVE NEGATIVE  Pregnancy, urine POC  Result Value Ref Range   Preg Test, Ur NEGATIVE NEGATIVE    Labs Reviewed  POCT URINALYSIS DIP (DEVICE)  POCT PREGNANCY, URINE    No results found.     ASSESSMENT & PLAN:  1. Vertigo     Meds ordered this encounter  Medications  . meclizine (ANTIVERT) 12.5 MG tablet  Sig: Take 1 tablet (12.5 mg total) by mouth 3 (three) times daily as needed for dizziness.    Dispense:  30 tablet    Refill:  0    Reviewed expectations re: course of current medical issues. Questions answered. Outlined signs and symptoms indicating need for more acute intervention. Patient verbalized understanding. After Visit Summary given.       Robyn Haber, MD 10/21/17 [phone]

## 2017-12-27 ENCOUNTER — Emergency Department (HOSPITAL_BASED_OUTPATIENT_CLINIC_OR_DEPARTMENT_OTHER): Payer: Medicaid Other

## 2017-12-27 ENCOUNTER — Other Ambulatory Visit: Payer: Self-pay

## 2017-12-27 ENCOUNTER — Encounter (HOSPITAL_BASED_OUTPATIENT_CLINIC_OR_DEPARTMENT_OTHER): Payer: Self-pay | Admitting: *Deleted

## 2017-12-27 ENCOUNTER — Emergency Department (HOSPITAL_BASED_OUTPATIENT_CLINIC_OR_DEPARTMENT_OTHER)
Admission: EM | Admit: 2017-12-27 | Discharge: 2017-12-27 | Disposition: A | Discharge status: Home / Self Care | Payer: Medicaid Other | Attending: Emergency Medicine | Admitting: Emergency Medicine

## 2017-12-27 DIAGNOSIS — M25511 Pain in right shoulder: Secondary | ICD-10-CM | POA: Insufficient documentation

## 2017-12-27 DIAGNOSIS — Z79899 Other long term (current) drug therapy: Secondary | ICD-10-CM | POA: Diagnosis not present

## 2017-12-27 MED ORDER — CYCLOBENZAPRINE HCL 10 MG PO TABS: 10.0000 mg | ORAL_TABLET | Freq: Three times a day (TID) | ORAL | 0 refills | Status: DC | PRN

## 2017-12-27 MED ORDER — GABAPENTIN 300 MG PO CAPS: 300.0000 mg | ORAL_CAPSULE | Freq: Three times a day (TID) | ORAL | 0 refills | Status: DC

## 2017-12-27 NOTE — ED Provider Notes (Signed)
[place] EMERGENCY DEPARTMENT Provider Note   CSN: [id number] Arrival date & time: 12/27/17  1933     History   Chief Complaint Chief Complaint  Patient presents with  . Shoulder Pain    HPI [name] is a 42 y.o. female.  She is complaining of right posterior shoulder pain since Sunday.  There is no trauma associated with it.  She does not recall any other overactivity or overuse injury with it she does have a prior history 5 years ago of her right partial lobectomy and has a large scar at the area she is complaining of her shoulder pain.  She mentions that she never ended up doing physical therapy for her arm and chest after the surgery.  Is not associated with any numbness or weakness in the arm although she generally will limit her activity when the pain bothers her.  This is happened before.  No fever no shortness of breath.  Pain increased with movement and improves with rest.  The history is provided by the patient.  Shoulder Pain   This is a recurrent problem. The current episode started more than 2 days ago. The problem occurs constantly. The pain is present in the right shoulder. The quality of the pain is described as sharp. The pain is moderate. Associated symptoms include limited range of motion. Pertinent negatives include no numbness. The symptoms are aggravated by activity. She has tried rest and heat for the symptoms. There has been no history of extremity trauma.    Past Medical History:  Diagnosis Date  . Alpha-1-antitrypsin deficiency ([place])   . Cancer [facility])     Patient Active Problem List   Diagnosis Date Noted  . Pneumothorax 10/18/2008  . CRYOTHERAPY, CERVIX, HX OF 10/18/2008  . PNEUMOTHORAX 10/18/2008    Past Surgical History:  Procedure Laterality Date  . CESAREAN SECTION    . LUNG BIOPSY    . SHOULDER SURGERY    . TUBAL LIGATION       OB History   None      Home Medications    Prior to Admission medications     Medication Sig Start Date End Date Taking? Authorizing Provider  meclizine (ANTIVERT) 12.5 MG tablet Take 1 tablet (12.5 mg total) by mouth 3 (three) times daily as needed for dizziness. 10/21/17   Robyn Haber, MD    Family History No family history on file.  Social History Social History   Tobacco Use  . Smoking status: Never Smoker  . Smokeless tobacco: Never Used  Substance Use Topics  . Alcohol use: No    Frequency: Never  . Drug use: No     Allergies   Patient has no known allergies.   Review of Systems Review of Systems  Constitutional: Negative for fever.  HENT: Negative for sore throat.   Respiratory: Negative for shortness of breath.   Cardiovascular: Negative for chest pain.  Gastrointestinal: Negative for abdominal pain.  Genitourinary: Negative for dysuria.  Skin: Negative for rash.  Neurological: Negative for numbness.     Physical Exam Updated Vital Signs BP 133/84   Pulse 98   Temp 99.3 F (37.4 C) (Oral)   Ht 5\' 3"  (1.6 m)   Wt 57.2 kg (126 lb)   LMP 12/24/2017   SpO2 100%   BMI 22.32 kg/m   Physical Exam  Constitutional: She appears well-developed and well-nourished.  HENT:  Head: Normocephalic and atraumatic.  Eyes: Conjunctivae are normal.  Neck: Neck supple.  Cardiovascular: Normal rate and regular rhythm.  Pulmonary/Chest: Effort normal and breath sounds normal.  Abdominal: Soft. Bowel sounds are normal. There is no tenderness.  Musculoskeletal:  She has a large well-healed thoracotomy scar posterior right chest wall.  There is no crepitus in the area.  She does have normal internal/external rotation of her shoulder but does not like being abducted with internal rotation.  Landmarks are normal.  Distal neurovascular intact.  Cervical spine is nontender midline.  There is no overlying skin findings to be suggestive of infection.  Neurological: She is alert. GCS eye subscore is 4. GCS verbal subscore is 5. GCS motor subscore is 6.   Skin: Skin is warm and dry. Capillary refill takes less than 2 seconds.  Psychiatric: She has a normal mood and affect.     ED Treatments / Results  Labs (all labs ordered are listed, but only abnormal results are displayed) Labs Reviewed - No data to display  EKG None  Radiology Dg Chest 2 View  Result Date: 12/27/2017 CLINICAL DATA:  Right shoulder pain over the last 5 days. EXAM: CHEST - 2 VIEW COMPARISON:  03/24/2010 FINDINGS: Heart size is normal. Left chest is clear except for mild scarring and emphysema. On the right, there has apparently been previous lobectomy. There is been thoracoplasty with rib reconstruction and chest wall reconstruction. I do not see any acute finding relative to that. IMPRESSION: Previous lobectomy on the right. Background emphysema and pulmonary scarring. Previous thoracoplasty on the right with rib reconstruction. No definable unexpected finding. Electronically Signed   By: Nelson Chimes M.D.   On: 12/27/2017 20:07   Dg Shoulder Right  Result Date: 12/27/2017 CLINICAL DATA:  Right shoulder pain over the last 5 days. EXAM: RIGHT SHOULDER - 2+ VIEW COMPARISON:  None FINDINGS: The shoulder self appears normal without evidence of degenerative change, narrowed humeral acromial distance or focal bone lesion. Patient has had extensive thoracoplasty on the right related to lung surgery with reconstruction 4 ribs. I do not see a definite unexpected or new finding related to that. IMPRESSION: No abnormality of the shoulders self. Previous chest surgery and thoracoplasty on the right. Electronically Signed   By: Nelson Chimes M.D.   On: 12/27/2017 20:05    Procedures Procedures (including critical care time)  Medications Ordered in ED Medications - No data to display   Initial Impression / Assessment and Plan / ED Course  I have reviewed the triage vital signs and the nursing notes.  Pertinent labs & imaging results that were available during my care of the  patient were reviewed by me and considered in my medical decision making (see chart for details).    ddx - fracture, pneumonia, ptx, dislocation, rotator cuff injury  Final Clinical Impressions(s) / ED Diagnoses   Final diagnoses:  Acute pain of right shoulder    ED Discharge Orders        Ordered    gabapentin (NEURONTIN) 300 MG capsule  3 times daily     12/27/17 2051    cyclobenzaprine (FLEXERIL) 10 MG tablet  3 times daily PRN     12/27/17 2051       Hayden Rasmussen, MD 12/28/17 1101

## 2017-12-27 NOTE — ED Triage Notes (Signed)
Onset of right shoulder pain that started on Sunday. Pain does improve with heat, worse to movement. No recent injury, hx of surgery 5 years ago on the same. Took tylenol this afternoon for pain.

## 2017-12-27 NOTE — Discharge Instructions (Signed)
You were evaluated in the emergency department for right shoulder pain.  This is been a chronic issue for you since your lung surgery.  You should continue the ibuprofen as tolerated on your stomach.  We are prescribing a muscle relaxant and gabapentin to help with the pain.  Please follow-up with your primary care doctor and request of them a referral to physical therapy.

## 2018-06-10 ENCOUNTER — Encounter (HOSPITAL_COMMUNITY): Payer: Self-pay

## 2018-06-10 ENCOUNTER — Emergency Department (HOSPITAL_COMMUNITY)
Admission: EM | Admit: 2018-06-10 | Discharge: 2018-06-10 | Disposition: A | Discharge status: Home / Self Care | Payer: Medicaid Other | Attending: Emergency Medicine | Admitting: Emergency Medicine

## 2018-06-10 DIAGNOSIS — S0181XA Laceration without foreign body of other part of head, initial encounter: Secondary | ICD-10-CM | POA: Insufficient documentation

## 2018-06-10 DIAGNOSIS — Y9389 Activity, other specified: Secondary | ICD-10-CM | POA: Diagnosis not present

## 2018-06-10 DIAGNOSIS — Y999 Unspecified external cause status: Secondary | ICD-10-CM | POA: Insufficient documentation

## 2018-06-10 DIAGNOSIS — S0990XA Unspecified injury of head, initial encounter: Secondary | ICD-10-CM

## 2018-06-10 DIAGNOSIS — Z79899 Other long term (current) drug therapy: Secondary | ICD-10-CM | POA: Insufficient documentation

## 2018-06-10 DIAGNOSIS — W228XXA Striking against or struck by other objects, initial encounter: Secondary | ICD-10-CM | POA: Insufficient documentation

## 2018-06-10 DIAGNOSIS — Y92008 Other place in unspecified non-institutional (private) residence as the place of occurrence of the external cause: Secondary | ICD-10-CM | POA: Diagnosis not present

## 2018-06-10 DIAGNOSIS — R51 Headache: Secondary | ICD-10-CM | POA: Diagnosis not present

## 2018-06-10 NOTE — ED Provider Notes (Signed)
[facility] DEPT Provider Note   CSN: [id number] Arrival date & time: 06/10/18  2052     History   Chief Complaint Chief Complaint  Patient presents with  . Facial Laceration  . Headache    HPI [name] is a 41 y.o. female.  HPI   [name] is a 42 y.o. female, presenting to the ED with injury to the face that occurred around 5:30 PM this evening.  Patient states she was lifting a chair out of the stack of chairs that was stuck together when the chair suddenly came loose and hit her in the face.  She endorses a frontal headache, throbbing, moderate, nonradiating.  She also has a laceration to the bridge of the nose.  Denies LOC, nausea/vomiting, dizziness, subsequent fall, neck/back pain, epistaxis, confusion, seizure, difficulty breathing, or any other complaints.     Past Medical History:  Diagnosis Date  . Alpha-1-antitrypsin deficiency ([place])   . Cancer [facility])     Patient Active Problem List   Diagnosis Date Noted  . Pneumothorax 10/18/2008  . CRYOTHERAPY, CERVIX, HX OF 10/18/2008  . PNEUMOTHORAX 10/18/2008    Past Surgical History:  Procedure Laterality Date  . CESAREAN SECTION    . LUNG BIOPSY    . SHOULDER SURGERY    . TUBAL LIGATION       OB History   None      Home Medications    Prior to Admission medications   Medication Sig Start Date End Date Taking? Authorizing Provider  cyclobenzaprine (FLEXERIL) 10 MG tablet Take 1 tablet (10 mg total) by mouth 3 (three) times daily as needed for muscle spasms. 12/27/17   Hayden Rasmussen, MD  gabapentin (NEURONTIN) 300 MG capsule Take 1 capsule (300 mg total) by mouth 3 (three) times daily. 12/27/17   Hayden Rasmussen, MD  meclizine (ANTIVERT) 12.5 MG tablet Take 1 tablet (12.5 mg total) by mouth 3 (three) times daily as needed for dizziness. 10/21/17   Robyn Haber, MD    Family History History reviewed. No pertinent family history.  Social  History Social History   Tobacco Use  . Smoking status: Never Smoker  . Smokeless tobacco: Never Used  Substance Use Topics  . Alcohol use: No    Frequency: Never  . Drug use: No     Allergies   Patient has no known allergies.   Review of Systems Review of Systems  HENT:       Facial laceration  Respiratory: Negative for shortness of breath.   Gastrointestinal: Negative for nausea and vomiting.  Musculoskeletal: Negative for back pain and neck pain.  Neurological: Positive for headaches. Negative for dizziness, syncope, weakness, light-headedness and numbness.  All other systems reviewed and are negative.    Physical Exam Updated Vital Signs BP 121/81 (BP Location: Right Arm)   Pulse (!) 105   Temp 99.1 F (37.3 C) (Oral)   Resp 16   Ht 5\' 3"  (1.6 m)   Wt 57.2 kg   LMP 05/23/2018   SpO2 99%   BMI 22.32 kg/m   Physical Exam  Constitutional: She is oriented to person, place, and time. She appears well-developed and well-nourished. No distress.  HENT:  Head: Normocephalic.  Some tenderness to the forehead and the bridge of the nose.  No swelling, deformity, bruising, erythema, or instability. 1 cm laceration across the bridge of the nose.  No active hemorrhage or signs of epistaxis.  No septal hematoma.  Nares  are patent bilaterally.  Eyes: Pupils are equal, round, and reactive to light. Conjunctivae and EOM are normal.  Neck: Normal range of motion. Neck supple.  Cardiovascular: Normal rate and regular rhythm.  Pulmonary/Chest: Effort normal. No respiratory distress.  Abdominal: There is no guarding.  Musculoskeletal: She exhibits no edema.  Normal motor function intact in all extremities. No midline spinal tenderness.   Neurological: She is alert and oriented to person, place, and time.  Sensation grossly intact to light touch in the extremities. Strength 5/5 in all extremities. No gait disturbance. Coordination intact. Cranial nerves III-XII grossly intact. No  facial droop.   Skin: Skin is warm and dry. She is not diaphoretic.  Psychiatric: She has a normal mood and affect. Her behavior is normal.  Nursing note and vitals reviewed.    ED Treatments / Results  Labs (all labs ordered are listed, but only abnormal results are displayed) Labs Reviewed - No data to display  EKG None  Radiology No results found.  Procedures .Marland KitchenLaceration Repair Date/Time: 06/10/2018 10:20 PM Performed by: Lorayne Bender, PA-C Authorized by: Lorayne Bender, PA-C   Consent:    Consent obtained:  Verbal   Consent given by:  Patient   Risks discussed:  Pain, poor cosmetic result, poor wound healing and need for additional repair Anesthesia (see MAR for exact dosages):    Anesthesia method:  None Laceration details:    Location:  Face   Face location:  Nose   Length (cm):  1 Repair type:    Repair type:  Simple Treatment:    Area cleansed with:  Saline   Amount of cleaning:  Standard   Irrigation solution:  Sterile saline   Irrigation method:  Syringe Skin repair:    Repair method:  Tissue adhesive Approximation:    Approximation:  Close Post-procedure details:    Dressing:  Open (no dressing)   Patient tolerance of procedure:  Tolerated well, no immediate complications   (including critical care time)  Medications Ordered in ED Medications - No data to display   Initial Impression / Assessment and Plan / ED Course  I have reviewed the triage vital signs and the nursing notes.  Pertinent labs & imaging results that were available during my care of the patient were reviewed by me and considered in my medical decision making (see chart for details).     Patient presents with an injury to the face.  Laceration repaired without incident.  Low suspicion for facial fracture. The patient was given instructions for home care as well as return precautions. Patient voices understanding of these instructions, accepts the plan, and is comfortable with  discharge.  Final Clinical Impressions(s) / ED Diagnoses   Final diagnoses:  Facial laceration, initial encounter  Injury of head, initial encounter    ED Discharge Orders    None       Layla Maw 06/10/18 2229    Maudie Flakes, MD 06/10/18 [phone]

## 2018-06-10 NOTE — Discharge Instructions (Signed)
Wound Care - Dermabond ° °Your wound has been closed with a medical-grade glue called Dermabond. Please adhere to the following wound care instructions: ° °You may shower, but avoid submerging the wound. Do not scrub the wound, as this may cause the glue to wear off prematurely.   ° °The glue will wear off on its own, usually within 5-10 days. During this time period DO NOT apply antibiotic ointments or any other ointments or lotions to the area as this can cause the glue to wear off prematurely. ° °After 10 days, you may apply ointments, such as Neosporin, to the area to help the remaining glue to wear off.  ° °After the wound has healed and the glue is gone, you may apply ointments such as Aquaphor to the wound to reduce scarring. ° °May use ibuprofen, naproxen, or Tylenol for pain. ° °Return to the ED should the wound edges come apart or signs of infection arise, such as spreading redness, puffiness/swelling, pus draining from the wound, severe increase in pain, or any other major issues. ° ° °Head Injury °You have been seen today for a head injury. It does not appear to be serious at this time.  °Close observation: The close observation period is usually 6 hours from the injury. This includes staying awake and having a trustworthy adult monitor you to assure your condition does not worsen. You should be in regular contact with this person and ideally, they should be able to monitor you in person.  °Secondary observation: The secondary observation period is usually 24 hours from the injury. You are allowed to sleep during this time. A trustworthy adult should intermittently monitor you to assure your condition does not worsen.  ° °Overall head injury/concussion care: °Rest: Be sure to get plenty of rest. You will need more rest and sleep while you recover. °Hydration: Be sure to stay well hydrated by having a goal of drinking about 0.5 liters of water an hour. °Pain:  °Antiinflammatory medications: Take 600 mg of  ibuprofen every 6 hours or 440 mg (over the counter dose) to 500 mg (prescription dose) of naproxen every 12 hours or for the next 3 days. After this time, these medications may be used as needed for pain. Take these medications with food to avoid upset stomach. Choose only one of these medications, do not take them together. °Tylenol: Should you continue to have additional pain while taking the ibuprofen or naproxen, you may add in tylenol as needed. Your daily total maximum amount of tylenol from all sources should be limited to 4000mg/day for persons without liver problems, or 2000mg/day for those with liver problems. °Return to sports and activities: In general, you may return to normal activities once symptoms have subsided, however, you would ideally be cleared by a primary care provider or other qualified medical professional prior to return to these activities. ° °Follow up: Follow up with the concussion clinic or your primary care provider for further management of this issue. °Return: Return to the ED should you begin to have confusion, abnormal behavior, aggression, violence, or personality changes, repeated vomiting, vision loss, numbness or weakness on one side of the body, difficulty standing due to dizziness, significantly worsening pain, or any other major concerns. °

## 2018-06-10 NOTE — ED Triage Notes (Signed)
Pt complains of a laceration to her nose and a headache, she states that she was picking up a chair on her porch and it hit her in the nose

## 2018-08-02 ENCOUNTER — Encounter (HOSPITAL_COMMUNITY): Payer: Self-pay

## 2018-08-02 ENCOUNTER — Ambulatory Visit (HOSPITAL_COMMUNITY)
Admission: EM | Admit: 2018-08-02 | Discharge: 2018-08-02 | Disposition: A | Discharge status: Home / Self Care | Payer: Medicaid Other | Attending: Family Medicine | Admitting: Family Medicine

## 2018-08-02 DIAGNOSIS — R059 Cough, unspecified: Secondary | ICD-10-CM

## 2018-08-02 DIAGNOSIS — J0100 Acute maxillary sinusitis, unspecified: Secondary | ICD-10-CM | POA: Diagnosis not present

## 2018-08-02 DIAGNOSIS — R05 Cough: Secondary | ICD-10-CM

## 2018-08-02 MED ORDER — DOXYCYCLINE HYCLATE 100 MG PO CAPS: 100.0000 mg | ORAL_CAPSULE | Freq: Two times a day (BID) | ORAL | 0 refills | Status: DC

## 2018-08-02 NOTE — ED Provider Notes (Signed)
[facility]   [id number] 08/02/18 Arrival Time: 2706   CC: URI symptoms   SUBJECTIVE: History from: patient.  [name] is a 42 y.o. female hx significant for alpha-1 antitrypsin and hx of lung cancer, who presents with onset of nasal congestion, sinus pressure, and productive cough x 1 week.  Admits to sick exposure to daughter with similar symptoms.  Has tried OTC medication with minimal relief.  Symptoms are made worse with lying down at night.  Reports previous symptoms in the past and diagnosed with sinus infection.  Complains of subjective fever, fatigue, chills, and sore throat.   Denies SOB, wheezing, chest pain, nausea, changes in bowel or bladder habits.    ROS: As per HPI.  Past Medical History:  Diagnosis Date  . Alpha-1-antitrypsin deficiency ([place])   . Cancer [facility])    Past Surgical History:  Procedure Laterality Date  . CESAREAN SECTION    . LUNG BIOPSY    . SHOULDER SURGERY    . TUBAL LIGATION     No Known Allergies No current facility-administered medications on file prior to encounter.    Current Outpatient Medications on File Prior to Encounter  Medication Sig Dispense Refill  . cyclobenzaprine (FLEXERIL) 10 MG tablet Take 1 tablet (10 mg total) by mouth 3 (three) times daily as needed for muscle spasms. 15 tablet 0  . gabapentin (NEURONTIN) 300 MG capsule Take 1 capsule (300 mg total) by mouth 3 (three) times daily. 90 capsule 0  . meclizine (ANTIVERT) 12.5 MG tablet Take 1 tablet (12.5 mg total) by mouth 3 (three) times daily as needed for dizziness. 30 tablet 0   Social History   Socioeconomic History  . Marital status: Single    Spouse name: Not on file  . Number of children: Not on file  . Years of education: Not on file  . Highest education level: Not on file  Occupational History  . Not on file  Social Needs  . Financial resource strain: Not on file  . Food insecurity:    Worry: Not on file    Inability: Not on file  .  Transportation needs:    Medical: Not on file    Non-medical: Not on file  Tobacco Use  . Smoking status: Never Smoker  . Smokeless tobacco: Never Used  Substance and Sexual Activity  . Alcohol use: No    Frequency: Never  . Drug use: No  . Sexual activity: Not on file  Lifestyle  . Physical activity:    Days per week: Not on file    Minutes per session: Not on file  . Stress: Not on file  Relationships  . Social connections:    Talks on phone: Not on file    Gets together: Not on file    Attends religious service: Not on file    Active member of club or organization: Not on file    Attends meetings of clubs or organizations: Not on file    Relationship status: Not on file  . Intimate partner violence:    Fear of current or ex partner: Not on file    Emotionally abused: Not on file    Physically abused: Not on file    Forced sexual activity: Not on file  Other Topics Concern  . Not on file  Social History Narrative  . Not on file   History reviewed. No pertinent family history.  OBJECTIVE:  Vitals:   08/02/18 1617  BP: 130/84  Pulse: Marland Kitchen)  110  Resp: 18  Temp: 97.8 F (36.6 C)  TempSrc: Oral  SpO2: 99%    Past vital signs reviewed with hx of tachycardia.    General appearance: alert; appears fatigued, but nontoxic; speaking in full sentences and tolerating own secretions HEENT: NCAT; Ears: EACs clear, TMs pearly gray; Eyes: PERRL.  EOM grossly intact. Sinuses: maxillary sinus tenderness; Nose: nares patent with copious rhinorrhea, Throat: oropharynx clear, tonsils non erythematous or enlarged, uvula midline  Neck: supple without LAD Lungs: unlabored respirations, symmetrical air entry; cough: mild; no respiratory distress; CTAB Heart: regular rate and rhythm.  Radial pulses 2+ symmetrical bilaterally Skin: warm and dry Psychological: alert and cooperative; normal mood and affect  ASSESSMENT & PLAN:  1. Acute non-recurrent maxillary sinusitis   2. Cough      Meds ordered this encounter  Medications  . doxycycline (VIBRAMYCIN) 100 MG capsule    Sig: Take 1 capsule (100 mg total) by mouth 2 (two) times daily.    Dispense:  20 capsule    Refill:  0    Order Specific Question:   Supervising Provider    Answer:   Wynona Luna [[id number]]    Get plenty of rest and push fluids Doxycycline prescribed.  Used as directed and to completion Continue with OTC medications as needed for relief Follow up with PCP or community Health if symptoms persist Return or go to ER if you have any new or worsening symptoms fever, chills, nausea, vomiting, chest pain, cough, shortness of breath, wheezing, abdominal pain, changes in bowel or bladder habits, etc...  Reviewed expectations re: course of current medical issues. Questions answered. Outlined signs and symptoms indicating need for more acute intervention. Patient verbalized understanding. After Visit Summary given.         Lestine Box, PA-C 08/02/18 1729

## 2018-08-02 NOTE — Discharge Instructions (Signed)
Get plenty of rest and push fluids Doxycycline prescribed.  Used as directed and to completion Continue with OTC medications as needed for relief Follow up with PCP or community Health if symptoms persist Return or go to ER if you have any new or worsening symptoms fever, chills, nausea, vomiting, chest pain, cough, shortness of breath, wheezing, abdominal pain, changes in bowel or bladder habits, etc..Marland Kitchen

## 2018-08-02 NOTE — ED Triage Notes (Signed)
Pt presents with upper respiratory symptoms; persistent cough, congestion, chills ,and generalized body aches.

## 2019-07-29 ENCOUNTER — Other Ambulatory Visit: Payer: Self-pay

## 2019-07-29 ENCOUNTER — Encounter (HOSPITAL_COMMUNITY): Payer: Self-pay

## 2019-07-29 ENCOUNTER — Ambulatory Visit (HOSPITAL_COMMUNITY)
Admission: EM | Admit: 2019-07-29 | Discharge: 2019-07-29 | Disposition: A | Discharge status: Home / Self Care | Payer: Medicaid Other | Attending: Family Medicine | Admitting: Family Medicine

## 2019-07-29 DIAGNOSIS — M5442 Lumbago with sciatica, left side: Secondary | ICD-10-CM

## 2019-07-29 MED ORDER — IBUPROFEN 800 MG PO TABS: 800.0000 mg | ORAL_TABLET | Freq: Three times a day (TID) | ORAL | 0 refills | Status: DC

## 2019-07-29 MED ORDER — GABAPENTIN 300 MG PO CAPS: 300.0000 mg | ORAL_CAPSULE | Freq: Three times a day (TID) | ORAL | 0 refills | Status: DC

## 2019-07-29 NOTE — ED Triage Notes (Signed)
Dr Sabra Heck  Has assessd the pt be fore the triage is complete. Pt states she has back side and hip pain x 3 months or more.

## 2019-07-29 NOTE — Discharge Instructions (Signed)
Recommend follow-up with [facility] for possible MRI

## 2019-07-29 NOTE — ED Provider Notes (Signed)
[facility]    CSN: [id number] Arrival date & time: 07/29/19  1125      History   Chief Complaint No chief complaint on file.   HPI [name] is a 43 y.o. female.   Patient presents with hip and leg pain that radiates to her foot.  Symptoms have gotten progressively worse over the last 2 weeks.  She was given a tapered dose of prednisone which did not have any effect per her history.  She called [facility] who said that she needed a referral.  She has a complicated history of alpha-1 antitrypsin which is managed by pulmonologist at [facility]  HPI  Past Medical History:  Diagnosis Date  . Alpha-1-antitrypsin deficiency ([place])   . Cancer [facility])     Patient Active Problem List   Diagnosis Date Noted  . Pneumothorax 10/18/2008  . CRYOTHERAPY, CERVIX, HX OF 10/18/2008  . PNEUMOTHORAX 10/18/2008    Past Surgical History:  Procedure Laterality Date  . CESAREAN SECTION    . LUNG BIOPSY    . SHOULDER SURGERY    . TUBAL LIGATION      OB History   No obstetric history on file.      Home Medications    Prior to Admission medications   Medication Sig Start Date End Date Taking? Authorizing Provider  cyclobenzaprine (FLEXERIL) 10 MG tablet Take 1 tablet (10 mg total) by mouth 3 (three) times daily as needed for muscle spasms. 12/27/17   Hayden Rasmussen, MD  doxycycline (VIBRAMYCIN) 100 MG capsule Take 1 capsule (100 mg total) by mouth 2 (two) times daily. 08/02/18   Wurst, [place], PA-C  gabapentin (NEURONTIN) 300 MG capsule Take 1 capsule (300 mg total) by mouth 3 (three) times daily. 12/27/17   Hayden Rasmussen, MD  meclizine (ANTIVERT) 12.5 MG tablet Take 1 tablet (12.5 mg total) by mouth 3 (three) times daily as needed for dizziness. 10/21/17   Robyn Haber, MD    Family History No family history on file.  Social History Social History   Tobacco Use  . Smoking status: Never Smoker  . Smokeless tobacco: Never  Used  Substance Use Topics  . Alcohol use: No    Frequency: Never  . Drug use: No     Allergies   Patient has no known allergies.   Review of Systems Review of Systems  Musculoskeletal: Positive for back pain.       Hip and leg pain with some numbness in her foot  All other systems reviewed and are negative.    Physical Exam Triage Vital Signs ED Triage Vitals [07/29/19 1148]  Enc Vitals Group     BP 115/77     Pulse Rate (!) 112     Resp 20     Temp 98.4 F (36.9 C)     Temp Source Temporal     SpO2 100 %     Weight      Height      Head Circumference      Peak Flow      Pain Score      Pain Loc      Pain Edu?      Excl. in [place]?    No data found.  Updated Vital Signs BP 115/77 (BP Location: Left Arm)   Pulse (!) 112   Temp 98.4 F (36.9 C) (Temporal)   Resp 20   SpO2 100%   Visual Acuity Right Eye Distance:  Left Eye Distance:   Bilateral Distance:    Right Eye Near:   Left Eye Near:    Bilateral Near:     Physical Exam Vitals signs and nursing note reviewed.  Constitutional:      Appearance: She is ill-appearing.  Musculoskeletal:     Comments: Patient is standing rather than seated in the room as this helps her back pain.  Palpation is nonrevealing.  Straight leg raising positive on the left.  Reflexes at the ankle show asymmetry suggestive of L5-S1 disease. She is able to walk on her toes and heels.  There is pain with flexion as well as lateral bending.  Neurological:     Mental Status: She is alert.      UC Treatments / Results  Labs (all labs ordered are listed, but only abnormal results are displayed) Labs Reviewed - No data to display  EKG   Radiology No results found.  Procedures Procedures (including critical care time)  Medications Ordered in UC Medications - No data to display  Initial Impression / Assessment and Plan / UC Course  I have reviewed the triage vital signs and the nursing notes.  Pertinent labs &  imaging results that were available during my care of the patient were reviewed by me and considered in my medical decision making (see chart for details).     Radicular back pain, possibly related to lumbar disc disease Final Clinical Impressions(s) / UC Diagnoses   Final diagnoses:  None   Discharge Instructions   None    ED Prescriptions    None     PDMP not reviewed this encounter.   Wardell Honour, MD 07/29/19 1236

## 2019-08-12 ENCOUNTER — Other Ambulatory Visit: Payer: Self-pay | Admitting: Nurse Practitioner

## 2019-08-12 DIAGNOSIS — M545 Low back pain, unspecified: Secondary | ICD-10-CM

## 2019-09-04 ENCOUNTER — Ambulatory Visit
Admission: RE | Admit: 2019-09-04 | Discharge: 2019-09-04 | Disposition: A | Discharge status: Home / Self Care | Payer: Medicaid Other | Source: Ambulatory Visit | Attending: Nurse Practitioner | Admitting: Nurse Practitioner

## 2019-09-04 ENCOUNTER — Other Ambulatory Visit: Payer: Self-pay

## 2019-09-04 DIAGNOSIS — M545 Low back pain, unspecified: Secondary | ICD-10-CM

## 2020-05-11 ENCOUNTER — Other Ambulatory Visit: Payer: Self-pay | Admitting: Neurosurgery

## 2020-05-11 DIAGNOSIS — M5416 Radiculopathy, lumbar region: Secondary | ICD-10-CM

## 2020-05-29 ENCOUNTER — Ambulatory Visit
Admission: RE | Admit: 2020-05-29 | Discharge: 2020-05-29 | Disposition: A | Discharge status: Home / Self Care | Payer: Medicaid Other | Source: Ambulatory Visit | Attending: Neurosurgery | Admitting: Neurosurgery

## 2020-05-29 ENCOUNTER — Other Ambulatory Visit: Payer: Self-pay

## 2020-05-29 DIAGNOSIS — M5416 Radiculopathy, lumbar region: Secondary | ICD-10-CM

## 2020-07-08 ENCOUNTER — Other Ambulatory Visit: Payer: Self-pay

## 2020-07-08 ENCOUNTER — Emergency Department
Admission: RE | Admit: 2020-07-08 | Discharge: 2020-07-08 | Disposition: A | Discharge status: Home / Self Care | Payer: Medicaid Other | Source: Ambulatory Visit

## 2020-07-08 VITALS — BP 133/86 | HR 109 | Temp 99.1°F | Resp 16

## 2020-07-08 DIAGNOSIS — L309 Dermatitis, unspecified: Secondary | ICD-10-CM

## 2020-07-08 DIAGNOSIS — L237 Allergic contact dermatitis due to plants, except food: Secondary | ICD-10-CM

## 2020-07-08 HISTORY — DX: Dorsalgia, unspecified: M54.9

## 2020-07-08 MED ORDER — METHYLPREDNISOLONE SODIUM SUCC 125 MG IJ SOLR
125.0000 mg | Freq: Once | INTRAMUSCULAR | Status: AC
  Administered 2020-07-08: 125 mg via INTRAMUSCULAR

## 2020-07-08 MED ORDER — HYDROXYZINE HCL 25 MG PO TABS: 12.5000 mg | ORAL_TABLET | Freq: Three times a day (TID) | ORAL | 0 refills | Status: DC | PRN

## 2020-07-08 MED ORDER — TRIAMCINOLONE ACETONIDE 0.1 % EX CREA: 1.0000 "application " | TOPICAL_CREAM | Freq: Two times a day (BID) | CUTANEOUS | 0 refills | Status: DC

## 2020-07-08 NOTE — ED Triage Notes (Signed)
Pt presents to Urgent Care with c/o rash, suspected poison oak/ivy x 1 day. Reports that she did yard work 3 days ago. Rash is localized to bil forearms, neck, and face.

## 2020-07-08 NOTE — ED Provider Notes (Signed)
[name]    CSN: [id number] Arrival date & time: 07/08/20  1544      History   Chief Complaint Chief Complaint  Patient presents with  . Rash    suspected poison oak; appt 4:00    HPI [name] is a 44 y.o. female.   HPI  Patient presents today for evaluation of a pruritic rash localized to upper extremities and the lower portion of her face and neck.  Patient reports doing yard work a couple days ago and has noticed rash occurring since that time.  She suspects she may have gotten to some poison ivy. She has been using some natural remedies to try and reduce the itching however the rash is spreading into the upper portion of her face and she is concerned about the irritant getting to her eyes.  Denies any other affected areas.  Past Medical History:  Diagnosis Date  . Alpha-1-antitrypsin deficiency ([place])   . Cancer [facility])     Patient Active Problem List   Diagnosis Date Noted  . Pneumothorax 10/18/2008  . CRYOTHERAPY, CERVIX, HX OF 10/18/2008  . PNEUMOTHORAX 10/18/2008    Past Surgical History:  Procedure Laterality Date  . CESAREAN SECTION    . LUNG BIOPSY    . SHOULDER SURGERY    . TUBAL LIGATION      OB History   No obstetric history on file.      Home Medications    Prior to Admission medications   Medication Sig Start Date End Date Taking? Authorizing Provider  cyclobenzaprine (FLEXERIL) 10 MG tablet Take 1 tablet (10 mg total) by mouth 3 (three) times daily as needed for muscle spasms. 12/27/17   Hayden Rasmussen, MD  doxycycline (VIBRAMYCIN) 100 MG capsule Take 1 capsule (100 mg total) by mouth 2 (two) times daily. 08/02/18   Wurst, [place], PA-C  gabapentin (NEURONTIN) 300 MG capsule Take 1 capsule (300 mg total) by mouth 3 (three) times daily. Patient taking differently: Take 600 mg by mouth 3 (three) times daily.  07/29/19   Wardell Honour, MD  ibuprofen (ADVIL) 800 MG tablet Take 1 tablet (800 mg total) by mouth 3  (three) times daily. 07/29/19   Wardell Honour, MD  meclizine (ANTIVERT) 12.5 MG tablet Take 1 tablet (12.5 mg total) by mouth 3 (three) times daily as needed for dizziness. 10/21/17   Robyn Haber, MD    Family History Family History  Problem Relation Age of Onset  . Diabetes Mother     Social History Social History   Tobacco Use  . Smoking status: Never Smoker  . Smokeless tobacco: Never Used  Substance Use Topics  . Alcohol use: No  . Drug use: No     Allergies   Patient has no known allergies.   Review of Systems Review of Systems Pertinent negatives listed in HPI  Physical Exam Triage Vital Signs ED Triage Vitals [07/08/20 1558]  Enc Vitals Group     BP      Pulse      Resp      Temp      Temp src      SpO2      Weight      Height      Head Circumference      Peak Flow      Pain Score 7     Pain Loc      Pain Edu?      Excl. in [place]?  No data found.  Updated Vital Signs There were no vitals taken for this visit.  Visual Acuity Right Eye Distance:   Left Eye Distance:   Bilateral Distance:    Right Eye Near:   Left Eye Near:    Bilateral Near:     Physical Exam General appearance: alert, well developed, well nourished, cooperative and in no distress Head: Normocephalic, without obvious abnormality, atraumatic Respiratory: Respirations even and unlabored, normal respiratory rate Heart: rate and rhythm normal. No gallop or murmurs noted on exam  Abdomen: BS +, no distention, no rebound tenderness, or no mass Extremities: No gross deformities Skin: Macular papular rash localized to the forearms bilaterally and chin and lower portion of the face, mild erythema present . Unaffected areas of the body normal skin color, texture, turgor normal. No rashes seen  Psych: Appropriate mood and affect. Neurologic: Mental status: Alert, oriented to person, place, and time, thought content appropriate.   UC Treatments / Results  Labs (all labs  ordered are listed, but only abnormal results are displayed) Labs Reviewed - No data to display  EKG   Radiology No results found.  Procedures Procedures (including critical care time)  Medications Ordered in UC Medications - No data to display  Initial Impression / Assessment and Plan / UC Course  I have reviewed the triage vital signs and the nursing notes.  Pertinent labs & imaging results that were available during my care of the patient were reviewed by me and considered in my medical decision making (see chart for details).    Treating for acute dermatitis likely related to poison ivy.  Patient given Solu-Medrol 125 mg here in clinic.  Discharged home with a topical triamcinolone cream with instructions to avoid use near her eyes or mouth.  Also prescribed hydroxyzine 3 times daily as needed for itching.  Patient is to return if symptoms do not resolve or if they worsen. Final Clinical Impressions(s) / UC Diagnoses   Final diagnoses:  Dermatitis  Poison ivy   Discharge Instructions   None    ED Prescriptions    Medication Sig Dispense Auth. Provider   triamcinolone cream (KENALOG) 0.1 % Apply 1 application topically 2 (two) times daily. 80 g Scot Jun, FNP   hydrOXYzine (ATARAX/VISTARIL) 25 MG tablet Take 0.5-1 tablets (12.5-25 mg total) by mouth every 8 (eight) hours as needed for itching. 30 tablet Scot Jun, FNP     PDMP not reviewed this encounter.   Scot Jun, FNP 07/08/20 [phone]

## 2020-08-16 ENCOUNTER — Other Ambulatory Visit: Payer: Self-pay

## 2020-08-16 ENCOUNTER — Emergency Department
Admission: RE | Admit: 2020-08-16 | Discharge: 2020-08-16 | Disposition: A | Discharge status: Home / Self Care | Payer: Medicaid Other | Source: Ambulatory Visit | Attending: Family Medicine | Admitting: Family Medicine

## 2020-08-16 VITALS — BP 120/79 | HR 95 | Temp 98.0°F | Resp 16

## 2020-08-16 DIAGNOSIS — M7711 Lateral epicondylitis, right elbow: Secondary | ICD-10-CM

## 2020-08-16 HISTORY — DX: Chronic obstructive pulmonary disease, unspecified: J44.9

## 2020-08-16 NOTE — ED Triage Notes (Signed)
Pt complain of R elbow and forearm pain for past 2 to 3 months off and on. Past 3 days pain has become more intense. Pt has not tried otf pain medications. Pt wrapped elbow and stated pain got worse in wrap so she removed it.

## 2020-08-16 NOTE — ED Provider Notes (Signed)
[name]    CSN: [id number] Arrival date & time: 08/16/20  1246      History   Chief Complaint Chief Complaint  Patient presents with  . Appointment  . Elbow Pain    HPI [name] is a 44 y.o. female.   Patient complains of pain in her right elbow and forearm intermittently during the past three months.  The pain has now become constant during the past 3 days and keeps her awake.  She recalls no injury or change in activities.  The history is provided by the patient.    Past Medical History:  Diagnosis Date  . Alpha-1-antitrypsin deficiency ([place])   . Back pain   . Cancer ([place])   . COPD (chronic obstructive pulmonary disease) [facility])     Patient Active Problem List   Diagnosis Date Noted  . Pneumothorax 10/18/2008  . CRYOTHERAPY, CERVIX, HX OF 10/18/2008  . PNEUMOTHORAX 10/18/2008    Past Surgical History:  Procedure Laterality Date  . CESAREAN SECTION    . LUNG BIOPSY    . SHOULDER SURGERY    . TUBAL LIGATION      OB History   No obstetric history on file.      Home Medications    Prior to Admission medications   Medication Sig Start Date End Date Taking? Authorizing Provider  alpha-1-proteinase inhibitor, human, (ZEMAIRA) 1000 MG SOLR injection Inject 60 mg/kg into the vein once a week. Or every other week    [provider]  cyclobenzaprine (FLEXERIL) 10 MG tablet Take 1 tablet (10 mg total) by mouth 3 (three) times daily as needed for muscle spasms. 12/27/17   Hayden Rasmussen, MD  doxycycline (VIBRAMYCIN) 100 MG capsule Take 1 capsule (100 mg total) by mouth 2 (two) times daily. 08/02/18   Wurst, [place], PA-C  gabapentin (NEURONTIN) 300 MG capsule Take 1 capsule (300 mg total) by mouth 3 (three) times daily. Patient taking differently: Take 600 mg by mouth 3 (three) times daily.  07/29/19   Wardell Honour, MD  hydrOXYzine (ATARAX/VISTARIL) 25 MG tablet Take 0.5-1 tablets (12.5-25 mg total) by mouth every 8 (eight)  hours as needed for itching. 07/08/20   Scot Jun, FNP  ibuprofen (ADVIL) 800 MG tablet Take 1 tablet (800 mg total) by mouth 3 (three) times daily. 07/29/19   Wardell Honour, MD  meclizine (ANTIVERT) 12.5 MG tablet Take 1 tablet (12.5 mg total) by mouth 3 (three) times daily as needed for dizziness. 10/21/17   Robyn Haber, MD  triamcinolone cream (KENALOG) 0.1 % Apply 1 application topically 2 (two) times daily. 07/08/20   Scot Jun, FNP    Family History Family History  Problem Relation Age of Onset  . Diabetes Mother   . Hypertension Mother     Social History Social History   Tobacco Use  . Smoking status: Former Smoker    Years: 15.00    Types: Cigarettes    Quit date: 2010    Years since quitting: 11.9  . Smokeless tobacco: Never Used  Substance Use Topics  . Alcohol use: Yes    Comment: seldomly  . Drug use: No     Allergies   Patient has no known allergies.   Review of Systems Review of Systems  Musculoskeletal: Negative for joint swelling.       Right elbow pain   Skin: Negative for rash.  All other systems reviewed and are negative.    Physical Exam Triage  Vital Signs ED Triage Vitals  Enc Vitals Group     BP 08/16/20 1305 120/79     Pulse Rate 08/16/20 1305 95     Resp 08/16/20 1305 16     Temp 08/16/20 1305 98 F (36.7 C)     Temp Source 08/16/20 1305 Oral     SpO2 08/16/20 1305 99 %     Weight --      Height --      Head Circumference --      Peak Flow --      Pain Score 08/16/20 1300 5     Pain Loc --      Pain Edu? --      Excl. in [place]? --    No data found.  Updated Vital Signs BP 120/79 (BP Location: Left Arm)   Pulse 95   Temp 98 F (36.7 C) (Oral)   Resp 16   LMP 07/15/2020 (Approximate)   SpO2 99%   Visual Acuity Right Eye Distance:   Left Eye Distance:   Bilateral Distance:    Right Eye Near:   Left Eye Near:    Bilateral Near:     Physical Exam Vitals and nursing note reviewed.    Constitutional:      General: She is not in acute distress. HENT:     Head: Normocephalic.  Cardiovascular:     Rate and Rhythm: Normal rate.  Pulmonary:     Effort: Pulmonary effort is normal.  Musculoskeletal:       Arms:     Cervical back: Normal range of motion.     Comments: Right elbow has full range of motion. There is distinct tenderness over the lateral epicondyle.  Palpation there during resisted dorsiflexion and supination of the wrist recreates her pain.  Distal neurovascular function is intact.   Skin:    General: Skin is warm and dry.     Findings: No rash.  Neurological:     General: No focal deficit present.     Mental Status: She is alert.      UC Treatments / Results  Labs (all labs ordered are listed, but only abnormal results are displayed) Labs Reviewed - No data to display  EKG   Radiology No results found.  Procedures Procedures (including critical care time)  Medications Ordered in UC Medications - No data to display  Initial Impression / Assessment and Plan / UC Course  I have reviewed the triage vital signs and the nursing notes.  Pertinent labs & imaging results that were available during my care of the patient were reviewed by me and considered in my medical decision making (see chart for details).    Tennis elbow brace applied. Given treatment instructions with range of motion and stretching exercises. Followup with Dr. Aundria Mems ([facility]) if not improving about two to three weeks.   Final Clinical Impressions(s) / UC Diagnoses   Final diagnoses:  Right lateral epicondylitis     Discharge Instructions     May take Ibuprofen 200mg , 3 or 4 tabs every 8 hours with food.  Wear elbow brace daytime. Apply ice pack for 20 to 30 minutes, 3 to 4 times daily  Continue until pain and swelling decrease.  Begin range of motion and stretching exercises as tolerated.    ED Prescriptions    None         Kandra Nicolas, MD 08/24/20 4403

## 2020-08-16 NOTE — Discharge Instructions (Addendum)
May take Ibuprofen 200mg , 3 or 4 tabs every 8 hours with food.  Wear elbow brace daytime. Apply ice pack for 20 to 30 minutes, 3 to 4 times daily  Continue until pain and swelling decrease.  Begin range of motion and stretching exercises as tolerated.

## 2021-01-02 ENCOUNTER — Other Ambulatory Visit: Payer: Self-pay | Admitting: Family Medicine

## 2021-01-02 DIAGNOSIS — Z1231 Encounter for screening mammogram for malignant neoplasm of breast: Secondary | ICD-10-CM

## 2021-01-07 ENCOUNTER — Emergency Department (HOSPITAL_BASED_OUTPATIENT_CLINIC_OR_DEPARTMENT_OTHER)
Admission: EM | Admit: 2021-01-07 | Discharge: 2021-01-08 | Disposition: A | Discharge status: Home / Self Care | Payer: Medicaid Other | Attending: Emergency Medicine | Admitting: Emergency Medicine

## 2021-01-07 ENCOUNTER — Encounter (HOSPITAL_BASED_OUTPATIENT_CLINIC_OR_DEPARTMENT_OTHER): Payer: Self-pay | Admitting: Emergency Medicine

## 2021-01-07 ENCOUNTER — Other Ambulatory Visit: Payer: Self-pay

## 2021-01-07 DIAGNOSIS — M79605 Pain in left leg: Secondary | ICD-10-CM | POA: Insufficient documentation

## 2021-01-07 DIAGNOSIS — J449 Chronic obstructive pulmonary disease, unspecified: Secondary | ICD-10-CM | POA: Diagnosis not present

## 2021-01-07 DIAGNOSIS — Z87891 Personal history of nicotine dependence: Secondary | ICD-10-CM | POA: Diagnosis not present

## 2021-01-07 DIAGNOSIS — M79672 Pain in left foot: Secondary | ICD-10-CM | POA: Insufficient documentation

## 2021-01-07 DIAGNOSIS — M5442 Lumbago with sciatica, left side: Secondary | ICD-10-CM | POA: Diagnosis not present

## 2021-01-07 DIAGNOSIS — M549 Dorsalgia, unspecified: Secondary | ICD-10-CM | POA: Diagnosis present

## 2021-01-07 DIAGNOSIS — Z859 Personal history of malignant neoplasm, unspecified: Secondary | ICD-10-CM | POA: Diagnosis not present

## 2021-01-07 DIAGNOSIS — M5432 Sciatica, left side: Secondary | ICD-10-CM

## 2021-01-07 MED ORDER — GABAPENTIN 300 MG PO CAPS
300.0000 mg | ORAL_CAPSULE | Freq: Once | ORAL | Status: AC
  Administered 2021-01-07: 300 mg via ORAL
  Filled 2021-01-07: qty 1

## 2021-01-07 MED ORDER — KETOROLAC TROMETHAMINE 30 MG/ML IJ SOLN
30.0000 mg | Freq: Once | INTRAMUSCULAR | Status: AC
  Administered 2021-01-07: 30 mg via INTRAMUSCULAR
  Filled 2021-01-07: qty 1

## 2021-01-07 NOTE — ED Triage Notes (Signed)
Reports chronic back pain that radiates down the right leg.  Hx of bulging disc and exposed nerve.  Reports having some kind of injection in the back on Wednesday.  Reports the pain has gotten worse.  Taking gabapentin 2-3 times a day.  Last dose 3 hours ago.

## 2021-01-07 NOTE — ED Provider Notes (Signed)
[facility] EMERGENCY DEPARTMENT Provider Note   CSN: [id number] Arrival date & time: 01/07/21  2242     History Chief Complaint  Patient presents with  . Back Pain    [name] is a 45 y.o. female.  HPI     This is a 45 year old female with a history of alpha 1 antitrypsin deficiency, sciatica with a herniated disc who presents with back pain.  Patient reports acute worsening of her chronic back pain earlier this evening.  She describes pain that radiates from the midline of the back all the way down the back of her left leg into her foot.  She has been dealing with this pain "for some time."  She reports that she has seen 2 specialists and has not been deemed a surgical candidate.  She has also seen pain management.  She is only taking gabapentin.  She did receive an injection on Wednesday and anticipated some worsening of pain after the injection; however, the pain actually worsened today.  She cannot tell me what kind of injection she had.  She rates her pain a 10 out of 10.  She states her left leg became tingly and shaky when she got out of the car earlier this evening.  She is not noted any bowel or bladder difficulty.  No weakness, numbness, strokelike symptoms.  Denies recent fevers.  Past Medical History:  Diagnosis Date  . Alpha-1-antitrypsin deficiency ([place])   . Back pain   . Cancer ([place])   . COPD (chronic obstructive pulmonary disease) [facility])     Patient Active Problem List   Diagnosis Date Noted  . Pneumothorax 10/18/2008  . CRYOTHERAPY, CERVIX, HX OF 10/18/2008  . PNEUMOTHORAX 10/18/2008    Past Surgical History:  Procedure Laterality Date  . CESAREAN SECTION    . LUNG BIOPSY    . SHOULDER SURGERY    . TUBAL LIGATION       OB History   No obstetric history on file.     Family History  Problem Relation Age of Onset  . Diabetes Mother   . Hypertension Mother     Social History   Tobacco Use  . Smoking status: Former Smoker     Years: 15.00    Types: Cigarettes    Quit date: 2010    Years since quitting: 12.2  . Smokeless tobacco: Never Used  Substance Use Topics  . Alcohol use: Yes    Comment: seldomly  . Drug use: No    Home Medications Prior to Admission medications   Medication Sig Start Date End Date Taking? Authorizing Provider  methylPREDNISolone (MEDROL DOSEPAK) 4 MG TBPK tablet Take as directed on tablet. 01/08/21  Yes Jeoffrey Eleazer, Barbette Hair, MD  alpha-1-proteinase inhibitor, human, (ZEMAIRA) 1000 MG SOLR injection Inject 60 mg/kg into the vein once a week. Or every other week    [provider]  gabapentin (NEURONTIN) 300 MG capsule Take 1 capsule (300 mg total) by mouth 3 (three) times daily. Patient taking differently: Take 600 mg by mouth 3 (three) times daily.  07/29/19   Wardell Honour, MD    Allergies    Patient has no known allergies.  Review of Systems   Review of Systems  Constitutional: Negative for fever.  Respiratory: Negative for shortness of breath.   Cardiovascular: Negative for chest pain.  Gastrointestinal: Negative for abdominal pain.  Genitourinary: Negative for difficulty urinating.  Musculoskeletal: Positive for back pain.  Neurological: Negative for weakness.  All other  systems reviewed and are negative.   Physical Exam Updated Vital Signs BP 124/71 (BP Location: Right Arm)   Pulse 85   Temp 98.3 F (36.8 C) (Oral)   Resp 18   Ht 1.6 m (5\' 3" )   Wt 59 kg   LMP 12/13/2020   SpO2 98%   BMI 23.03 kg/m   Physical Exam Vitals and nursing note reviewed.  Constitutional:      Appearance: She is well-developed.  HENT:     Head: Normocephalic and atraumatic.     Mouth/Throat:     Mouth: Mucous membranes are moist.  Eyes:     Pupils: Pupils are equal, round, and reactive to light.  Cardiovascular:     Rate and Rhythm: Normal rate and regular rhythm.     Heart sounds: Normal heart sounds.  Pulmonary:     Effort: Pulmonary effort is normal. No  respiratory distress.     Breath sounds: No wheezing.  Abdominal:     General: Bowel sounds are normal.     Palpations: Abdomen is soft.  Musculoskeletal:     Cervical back: Neck supple.     Right lower leg: No edema.     Left lower leg: No edema.     Comments: Tenderness palpation lower lumbar spine in the midline, no step-off or deformity noted  Skin:    General: Skin is warm and dry.  Neurological:     Mental Status: She is alert and oriented to person, place, and time.     Comments: Positive left straight leg raise, 5 out of 5 strength in bilateral lower extremities, symmetric patellar reflexes bilaterally, no clonus  Psychiatric:        Mood and Affect: Mood normal.     ED Results / Procedures / Treatments   Labs (all labs ordered are listed, but only abnormal results are displayed) Labs Reviewed - No data to display  EKG None  Radiology No results found.  Procedures Procedures   Medications Ordered in ED Medications  ketorolac (TORADOL) 30 MG/ML injection 30 mg (30 mg Intramuscular Given 01/07/21 2342)  gabapentin (NEURONTIN) capsule 300 mg (300 mg Oral Given 01/07/21 2341)    ED Course  I have reviewed the triage vital signs and the nursing notes.  Pertinent labs & imaging results that were available during my care of the patient were reviewed by me and considered in my medical decision making (see chart for details).    MDM Rules/Calculators/A&P                          Patient presents with worsening of her chronic sciatica.  She is overall nontoxic and vital signs reassuring.  She has no signs or symptoms of cauda equina.  Neurologic exam is intact.  Symptoms do seem consistent with sciatica and nerve pain.  She is only taking gabapentin.  She does report recent injection.  No overlying skin changes at the injection site.  She is afebrile.  Given reassuring exam, will treat supportively.  Do not feel she needs further imaging at this time.  She was given  Toradol and an additional dose of gabapentin.  On recheck, she reports significant relief of discomfort.  Recommend a Medrol Dosepak.  She reports she does not tolerate ibuprofen.  She will follow-up with her neurosurgeon.  After history, exam, and medical workup I feel the patient has been appropriately medically screened and is safe for discharge home. Pertinent diagnoses were  discussed with the patient. Patient was given return precautions.  Final Clinical Impression(s) / ED Diagnoses Final diagnoses:  Sciatica of left side    Rx / DC Orders ED Discharge Orders         Ordered    methylPREDNISolone (MEDROL DOSEPAK) 4 MG TBPK tablet        01/08/21 0034           Ozie Lupe, Barbette Hair, MD 01/08/21 [phone]

## 2021-01-07 NOTE — ED Notes (Signed)
Pain left lower back off/on for over one year.  Injection on Wednesday for such but worse tonight.  Radiates down left leg.

## 2021-01-08 MED ORDER — METHYLPREDNISOLONE 4 MG PO TBPK: ORAL_TABLET | ORAL | 0 refills | Status: DC

## 2021-01-08 NOTE — Discharge Instructions (Signed)
You were seen today for ongoing back pain.  Follow-up with your specialist.  Take the Medrol Dosepak.

## 2021-01-08 NOTE — ED Notes (Signed)
Instructions and prednisone dose pack reviewed with patient.  Left ambulatory

## 2021-03-09 ENCOUNTER — Emergency Department
Admission: RE | Admit: 2021-03-09 | Discharge: 2021-03-09 | Disposition: A | Discharge status: Home / Self Care | Payer: Medicaid Other | Source: Ambulatory Visit

## 2021-03-09 ENCOUNTER — Other Ambulatory Visit: Payer: Self-pay

## 2021-03-09 VITALS — BP 122/79 | HR 97 | Temp 98.8°F

## 2021-03-09 DIAGNOSIS — J0101 Acute recurrent maxillary sinusitis: Secondary | ICD-10-CM

## 2021-03-09 DIAGNOSIS — J209 Acute bronchitis, unspecified: Secondary | ICD-10-CM

## 2021-03-09 MED ORDER — CEFDINIR 300 MG PO CAPS: 300.0000 mg | ORAL_CAPSULE | Freq: Two times a day (BID) | ORAL | 0 refills | Status: DC

## 2021-03-09 NOTE — ED Provider Notes (Signed)
[name]    CSN: [id number] Arrival date & time: 03/09/21  1852      History   Chief Complaint Chief Complaint  Patient presents with   Appointment    Possible ear and sinus infection    HPI [name] is a 45 y.o. female.   One week ago patient developed cough and sinus congestion that has gradually worsened, now with facial pressure and left earache.  Her cough awakens her at night. Medical history includes non-small cell cancer of right lung, status post upper lobectomy.  She denies wheezing, shortness of breath, and pleuritic pain.  The history is provided by the patient.   Past Medical History:  Diagnosis Date   Alpha-1-antitrypsin deficiency ([place])    Back pain    Cancer ([place])    COPD (chronic obstructive pulmonary disease) ([place])     Patient Active Problem List   Diagnosis Date Noted   Pneumothorax 10/18/2008   CRYOTHERAPY, CERVIX, HX OF 10/18/2008   PNEUMOTHORAX 10/18/2008    Past Surgical History:  Procedure Laterality Date   CESAREAN SECTION     LUNG BIOPSY     SHOULDER SURGERY     TUBAL LIGATION      OB History   No obstetric history on file.      Home Medications    Prior to Admission medications   Medication Sig Start Date End Date Taking? Authorizing Provider  cefdinir (OMNICEF) 300 MG capsule Take 1 capsule (300 mg total) by mouth 2 (two) times daily. 03/09/21  Yes Kandra Nicolas, MD  gabapentin (NEURONTIN) 300 MG capsule Take 1 capsule (300 mg total) by mouth 3 (three) times daily. Patient taking differently: Take 600 mg by mouth 3 (three) times daily. 07/29/19  Yes Wardell Honour, MD  norethindrone (AYGESTIN) 5 MG tablet Take 5 mg by mouth 2 (two) times daily. 02/23/21  Yes [provider]  alpha-1-proteinase inhibitor, human, (ZEMAIRA) 1000 MG SOLR injection Inject 60 mg/kg into the vein once a week. Or every other week    [provider]  methylPREDNISolone (MEDROL DOSEPAK) 4 MG TBPK tablet Take  as directed on tablet. 01/08/21   Horton, Barbette Hair, MD    Family History Family History  Problem Relation Age of Onset   Diabetes Mother    Hypertension Mother     Social History Social History   Tobacco Use   Smoking status: Former    Years: 15.00    Pack years: 0.00    Types: Cigarettes    Quit date: 2010    Years since quitting: 12.4   Smokeless tobacco: Never  Substance Use Topics   Alcohol use: Yes    Comment: seldomly   Drug use: No     Allergies   Patient has no known allergies.   Review of Systems Review of Systems No sore throat + cough No pleuritic pain No wheezing + nasal congestion + post-nasal drainage + sinus pain/pressure No itchy/red eyes + left earache No hemoptysis No SOB No fever/chills No nausea No vomiting No abdominal pain No diarrhea No urinary symptoms No skin rash + fatigue No myalgias + headache Used OTC meds (Dayquil, Nasacort, Mucinex) without relief   Physical Exam Triage Vital Signs ED Triage Vitals  Enc Vitals Group     BP 03/09/21 1921 122/79     Pulse Rate 03/09/21 1921 97     Resp --      Temp 03/09/21 1921 98.8 F (37.1 C)  Temp Source 03/09/21 1921 Oral     SpO2 03/09/21 1921 99 %     Weight --      Height --      Head Circumference --      Peak Flow --      Pain Score 03/09/21 1923 0     Pain Loc --      Pain Edu? --      Excl. in [employer]? --    No data found.  Updated Vital Signs BP 122/79 (BP Location: Right Arm)   Pulse 97   Temp 98.8 F (37.1 C) (Oral)   SpO2 99%   Visual Acuity Right Eye Distance:   Left Eye Distance:   Bilateral Distance:    Right Eye Near:   Left Eye Near:    Bilateral Near:     Physical Exam Nursing notes and Vital Signs reviewed. Appearance:  Patient appears stated age, and in no acute distress Eyes:  Pupils are equal, round, and reactive to light and accomodation.  Extraocular movement is intact.  Conjunctivae are not inflamed  Ears:  Canals normal.   Right  tympanic membrane normal; left tympanic membrane mildly erythematous. Nose: Congested turbinates. Maxillary sinus tenderness is present.  Pharynx:  Normal Neck:  Supple.  Mildly enlarged lateral nodes are present, tender to palpation on the left.   Lungs:  Clear to auscultation.  Breath sounds are equal.  Moving air well. Heart:  Regular rate and rhythm without murmurs, rubs, or gallops.  Abdomen:  Nontender without masses or hepatosplenomegaly.  Bowel sounds are present.  No CVA or flank tenderness.  Extremities:  No edema.  Skin:  No rash present.   UC Treatments / Results  Labs (all labs ordered are listed, but only abnormal results are displayed) Labs Reviewed - No data to display  EKG   Radiology No results found.  Procedures Procedures (including critical care time)  Medications Ordered in UC Medications - No data to display  Initial Impression / Assessment and Plan / UC Course  I have reviewed the triage vital signs and the nursing notes.  Pertinent labs & imaging results that were available during my care of the patient were reviewed by me and considered in my medical decision making (see chart for details).    Begin Omnicef. Followup with Family Doctor if not improved in one week.   Final Clinical Impressions(s) / UC Diagnoses   Final diagnoses:  Acute recurrent maxillary sinusitis  Acute bronchitis, unspecified organism     Discharge Instructions      Continue Mucinex-D, with plenty of water, for cough and congestion. Get adequate rest.   May take Delsym Cough Suppressant ("12 Hour Cough Relief") at bedtime for nighttime cough.  Stop all antihistamines for now, and other non-prescription cough/cold preparations. Continue Nasacort nasal spray.   If symptoms become significantly worse during the night or over the weekend, proceed to the local emergency room.      ED Prescriptions     Medication Sig Dispense Auth. Provider   cefdinir (OMNICEF) 300 MG  capsule Take 1 capsule (300 mg total) by mouth 2 (two) times daily. 20 capsule Kandra Nicolas, MD         Kandra Nicolas, MD 03/12/21 1000

## 2021-03-09 NOTE — ED Triage Notes (Signed)
Patient c/o possible left ear infection and sinus infection.  Patient does have a non-productive cough, sinus pressure/pain x 1 week.  Patient has taken Dayquil, Allergy meds, Sinusx, Nasacort.  Patient is vaccinated for COVID.

## 2021-03-09 NOTE — Discharge Instructions (Addendum)
Continue Mucinex-D, with plenty of water, for cough and congestion. Get adequate rest.   May take Delsym Cough Suppressant ("12 Hour Cough Relief") at bedtime for nighttime cough.  Stop all antihistamines for now, and other non-prescription cough/cold preparations. Continue Nasacort nasal spray.   If symptoms become significantly worse during the night or over the weekend, proceed to the local emergency room.

## 2021-04-12 ENCOUNTER — Other Ambulatory Visit: Payer: Self-pay | Admitting: Neurosurgery

## 2021-04-12 DIAGNOSIS — M5136 Other intervertebral disc degeneration, lumbar region: Secondary | ICD-10-CM

## 2021-06-08 ENCOUNTER — Other Ambulatory Visit: Payer: Medicaid Other

## 2021-06-17 ENCOUNTER — Inpatient Hospital Stay: Admission: RE | Admit: 2021-06-17 | Payer: Medicaid Other | Source: Ambulatory Visit

## 2021-07-12 ENCOUNTER — Other Ambulatory Visit: Payer: Self-pay

## 2021-07-12 ENCOUNTER — Ambulatory Visit
Admission: RE | Admit: 2021-07-12 | Discharge: 2021-07-12 | Disposition: A | Discharge status: Home / Self Care | Payer: Medicaid Other | Source: Ambulatory Visit | Attending: Neurosurgery | Admitting: Neurosurgery

## 2021-07-12 DIAGNOSIS — M5136 Other intervertebral disc degeneration, lumbar region: Secondary | ICD-10-CM

## 2022-03-01 ENCOUNTER — Emergency Department (INDEPENDENT_AMBULATORY_CARE_PROVIDER_SITE_OTHER)
Admission: EM | Admit: 2022-03-01 | Discharge: 2022-03-01 | Disposition: A | Discharge status: Home / Self Care | Payer: Medicaid Other | Source: Home / Self Care

## 2022-03-01 DIAGNOSIS — L237 Allergic contact dermatitis due to plants, except food: Secondary | ICD-10-CM

## 2022-03-01 MED ORDER — PREDNISONE 10 MG (21) PO TBPK: ORAL_TABLET | Freq: Every day | ORAL | 0 refills | Status: DC

## 2022-03-01 MED ORDER — METHYLPREDNISOLONE ACETATE 80 MG/ML IJ SUSP
80.0000 mg | Freq: Once | INTRAMUSCULAR | Status: AC
  Administered 2022-03-01: 80 mg via INTRAMUSCULAR

## 2022-03-01 NOTE — ED Provider Notes (Signed)
[name]    CSN: [id number] Arrival date & time: 03/01/22  1115      History   Chief Complaint Chief Complaint  Patient presents with   Poison Ivy    HPI [name] is a 46 y.o. female.   HPI 46 year old female presents with poison ivy dermatitis for 4 days.  PMH significant for COPD and cancer.  Past Medical History:  Diagnosis Date   Alpha-1-antitrypsin deficiency ([place])    Back pain    Cancer ([place])    COPD (chronic obstructive pulmonary disease) ([place])     Patient Active Problem List   Diagnosis Date Noted   Pneumothorax 10/18/2008   CRYOTHERAPY, CERVIX, HX OF 10/18/2008   PNEUMOTHORAX 10/18/2008    Past Surgical History:  Procedure Laterality Date   CESAREAN SECTION     LUNG BIOPSY     SHOULDER SURGERY     TUBAL LIGATION      OB History   No obstetric history on file.      Home Medications    Prior to Admission medications   Medication Sig Start Date End Date Taking? Authorizing Provider  azelastine (ASTELIN) 0.1 % nasal spray Place into both nostrils 2 (two) times daily. Use in each nostril as directed   Yes [provider]  meloxicam (MOBIC) 15 MG tablet Take 15 mg by mouth daily.   Yes [provider]  predniSONE (STERAPRED UNI-PAK 21 TAB) 10 MG (21) TBPK tablet Take by mouth daily. Take 6 tabs by mouth daily  for 2 days, then 5 tabs for 2 days, then 4 tabs for 2 days, then 3 tabs for 2 days, 2 tabs for 2 days, then 1 tab by mouth daily for 2 days 03/01/22  Yes Eliezer Lofts, FNP  pregabalin (LYRICA) 75 MG capsule Take 75 mg by mouth 2 (two) times daily.   Yes [provider]  rosuvastatin (CRESTOR) 10 MG tablet Take 10 mg by mouth daily.   Yes [provider]  tiZANidine (ZANAFLEX) 4 MG capsule Take 4 mg by mouth 3 (three) times daily.   Yes [provider]  alpha-1-proteinase inhibitor, human, (ZEMAIRA) 1000 MG SOLR injection Inject 60 mg/kg into the vein once a week. Or every other  week    [provider]  gabapentin (NEURONTIN) 300 MG capsule Take 1 capsule (300 mg total) by mouth 3 (three) times daily. Patient taking differently: Take 600 mg by mouth 3 (three) times daily. 07/29/19   Wardell Honour, MD  norethindrone (AYGESTIN) 5 MG tablet Take 5 mg by mouth 2 (two) times daily. 02/23/21   [provider]    Family History Family History  Problem Relation Age of Onset   Diabetes Mother    Hypertension Mother     Social History Social History   Tobacco Use   Smoking status: Former    Years: 15.00    Types: Cigarettes    Quit date: 2010    Years since quitting: 13.4   Smokeless tobacco: Never  Substance Use Topics   Alcohol use: Yes    Comment: seldomly   Drug use: No     Allergies   Patient has no known allergies.   Review of Systems Review of Systems  Skin:  Positive for rash.     Physical Exam Triage Vital Signs ED Triage Vitals  Enc Vitals Group     BP 03/01/22 1144 132/83     Pulse Rate 03/01/22 1144 82  Resp 03/01/22 1144 14     Temp 03/01/22 1144 98.7 F (37.1 C)     Temp Source 03/01/22 1144 Oral     SpO2 03/01/22 1144 100 %     Weight --      Height --      Head Circumference --      Peak Flow --      Pain Score 03/01/22 1146 0     Pain Loc --      Pain Edu? --      Excl. in [place]? --    No data found.  Updated Vital Signs BP 132/83 (BP Location: Left Arm)   Pulse 82   Temp 98.7 F (37.1 C) (Oral)   Resp 14   SpO2 100%       Physical Exam Vitals and nursing note reviewed.  Constitutional:      Appearance: Normal appearance. She is normal weight.  HENT:     Head: Normocephalic and atraumatic.     Mouth/Throat:     Mouth: Mucous membranes are moist.     Pharynx: Oropharynx is clear.  Eyes:     Extraocular Movements: Extraocular movements intact.     Conjunctiva/sclera: Conjunctivae normal.     Pupils: Pupils are equal, round, and reactive to light.  Cardiovascular:     Rate and  Rhythm: Normal rate and regular rhythm.     Pulses: Normal pulses.     Heart sounds: Normal heart sounds.  Pulmonary:     Effort: Pulmonary effort is normal.     Breath sounds: Normal breath sounds. No wheezing, rhonchi or rales.  Musculoskeletal:     Cervical back: Normal range of motion and neck supple.  Skin:    General: Skin is warm and dry.     Comments: Face/neck/chest/upper/lower arms/lower abdomen/groin/upper/lower legs: Pruritic erythematous maculopapular eruption with grouped linear vesicular lesions noted  Neurological:     General: No focal deficit present.     Mental Status: She is alert and oriented to person, place, and time.      UC Treatments / Results  Labs (all labs ordered are listed, but only abnormal results are displayed) Labs Reviewed - No data to display  EKG   Radiology No results found.  Procedures Procedures (including critical care time)  Medications Ordered in UC Medications  methylPREDNISolone acetate (DEPO-MEDROL) injection 80 mg (80 mg Intramuscular Given 03/01/22 1238)    Initial Impression / Assessment and Plan / UC Course  I have reviewed the triage vital signs and the nursing notes.  Pertinent labs & imaging results that were available during my care of the patient were reviewed by me and considered in my medical decision making (see chart for details).     MDM: 1.  Poison ivy dermatitis-IM Depo-Medrol 80 mg given once in clinic prior to discharge, Rx'd Sterapred pack. Advised patient to take medication as directed with food to completion.  Encouraged patient to increase daily water intake while taking this medication.  Advised patient to change bed linens for the next 3 nights to avoid recontamination.  Advised patient if symptoms worsen and/or unresolved please follow-up with PCP or here for further evaluation. Final Clinical Impressions(s) / UC Diagnoses   Final diagnoses:  Poison ivy dermatitis     Discharge Instructions       Advised patient to take medication as directed with food to completion.  Encouraged patient to increase daily water intake while taking this medication.  Advised patient to change bed  linens for the next 3 nights to avoid recontamination.  Advised patient if symptoms worsen and/or unresolved please follow-up with PCP or here for further evaluation.     ED Prescriptions     Medication Sig Dispense Auth. Provider   predniSONE (STERAPRED UNI-PAK 21 TAB) 10 MG (21) TBPK tablet Take by mouth daily. Take 6 tabs by mouth daily  for 2 days, then 5 tabs for 2 days, then 4 tabs for 2 days, then 3 tabs for 2 days, 2 tabs for 2 days, then 1 tab by mouth daily for 2 days 42 tablet Eliezer Lofts, FNP      PDMP not reviewed this encounter.   Eliezer Lofts, [place] 03/01/22 1326

## 2022-03-01 NOTE — ED Triage Notes (Signed)
Pt presents with exposure and rash from poison oak that began monday

## 2022-03-01 NOTE — Discharge Instructions (Addendum)
Advised patient to take medication as directed with food to completion.  Encouraged patient to increase daily water intake while taking this medication.  Advised patient to change bed linens for the next 3 nights to avoid recontamination.  Advised patient if symptoms worsen and/or unresolved please follow-up with PCP or here for further evaluation.

## 2022-10-06 ENCOUNTER — Emergency Department (HOSPITAL_COMMUNITY)
Admission: EM | Admit: 2022-10-06 | Discharge: 2022-10-06 | Disposition: A | Discharge status: Home / Self Care | Payer: Medicaid Other | Attending: Emergency Medicine | Admitting: Emergency Medicine

## 2022-10-06 ENCOUNTER — Other Ambulatory Visit: Payer: Self-pay

## 2022-10-06 DIAGNOSIS — Z046 Encounter for general psychiatric examination, requested by authority: Secondary | ICD-10-CM | POA: Insufficient documentation

## 2022-10-06 DIAGNOSIS — Z5900 Homelessness unspecified: Secondary | ICD-10-CM | POA: Diagnosis not present

## 2022-10-06 DIAGNOSIS — M545 Low back pain, unspecified: Secondary | ICD-10-CM | POA: Insufficient documentation

## 2022-10-06 DIAGNOSIS — Z008 Encounter for other general examination: Secondary | ICD-10-CM

## 2022-10-06 DIAGNOSIS — Z0000 Encounter for general adult medical examination without abnormal findings: Secondary | ICD-10-CM | POA: Insufficient documentation

## 2022-10-06 LAB — COMPREHENSIVE METABOLIC PANEL
ALT: 101 U/L — ABNORMAL HIGH (ref 0–44)
AST: 66 U/L — ABNORMAL HIGH (ref 15–41)
Albumin: 4.3 g/dL (ref 3.5–5.0)
Alkaline Phosphatase: 78 U/L (ref 38–126)
Anion gap: 12 (ref 5–15)
BUN: 16 mg/dL (ref 6–20)
CO2: 20 mmol/L — ABNORMAL LOW (ref 22–32)
Calcium: 8.9 mg/dL (ref 8.9–10.3)
Chloride: 105 mmol/L (ref 98–111)
Creatinine, Ser: 0.95 mg/dL (ref 0.44–1.00)
GFR, Estimated: >60 mL/min (ref >60)
Glucose, Bld: 75 mg/dL (ref 70–99)
Potassium: 4 mmol/L (ref 3.5–5.1)
Sodium: 137 mmol/L (ref 135–145)
Total Bilirubin: 0.9 mg/dL (ref 0.3–1.2)
Total Protein: 7.9 g/dL (ref 6.5–8.1)

## 2022-10-06 LAB — SALICYLATE LEVEL: Salicylate Lvl: <7 mg/dL — ABNORMAL LOW (ref 7.0–30.0)

## 2022-10-06 LAB — HCG, SERUM, QUALITATIVE: Preg, Serum: NEGATIVE

## 2022-10-06 LAB — CBC
HCT: 47.1 % — ABNORMAL HIGH (ref 36.0–46.0)
Hemoglobin: 15.5 g/dL — ABNORMAL HIGH (ref 12.0–15.0)
MCH: 29.4 pg (ref 26.0–34.0)
MCHC: 32.9 g/dL (ref 30.0–36.0)
MCV: 89.2 fL (ref 80.0–100.0)
Platelets: 360 10*3/uL (ref 150–400)
RBC: 5.28 MIL/uL — ABNORMAL HIGH (ref 3.87–5.11)
RDW: 12.9 % (ref 11.5–15.5)
WBC: 8.3 10*3/uL (ref 4.0–10.5)
nRBC: 0 % (ref 0.0–0.2)

## 2022-10-06 LAB — ACETAMINOPHEN LEVEL: Acetaminophen (Tylenol), Serum: <10 ug/mL — ABNORMAL LOW (ref 10–30)

## 2022-10-06 LAB — ETHANOL: Alcohol, Ethyl (B): <10 mg/dL (ref <10)
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
## 2022-10-06 NOTE — ED Triage Notes (Signed)
Pt would like to speak to a psychiatrist; she states that her goal is to be cleared from a mental health standpoint as she makes efforts to regain custody of her children.  Endorses loneliness, homelessness, chronic low back pain.  Pt denies current SI/HI, hallucinations. Denies drug or alcohol use.

## 2022-10-06 NOTE — Discharge Instructions (Signed)
Please drive over to behavioral health urgent care for evaluation by mental health professional.

## 2022-10-07 ENCOUNTER — Ambulatory Visit (HOSPITAL_COMMUNITY)
Admission: EM | Admit: 2022-10-07 | Discharge: 2022-10-07 | Disposition: A | Discharge status: Home / Self Care | Payer: Medicaid Other | Attending: Behavioral Health | Admitting: Behavioral Health

## 2022-10-07 DIAGNOSIS — F4321 Adjustment disorder with depressed mood: Secondary | ICD-10-CM | POA: Insufficient documentation

## 2022-10-07 NOTE — Progress Notes (Signed)
Patient is a 47 year old female who presents voluntarily to [facility] due to feeling as she is spiraling out of control. Patient was evicted from her home in September 04, 2023 and has not had stable housing since. Patient hs not been able to feed her 2 minor children and they are now living with their father. Patient is feeling depressed as she does not know what to do to get them back. Patient made threatening statement when she was first evited that she just want to die. However there was no intent or plan. Today she denies SI/HI or AVH.   Patient was casually dressed.  She was tearful as she talked about her situation. Patient was alert & oriented.    10/07/22 1345  [place] (Walk-ins at [facility] only)  What Is the Reason for Your Visit/Call Today? Patient is a 47 year old female who presents voluntarily to [facility]  due to feeling as she is spiraling out of control.  Patient was evicted from her home in 04-Sep-2023 and has not had stable housing since.  Patient hs not been able to feed her 2 minor children and they are now living with their father.  Patient is feeling depressed as she does not know what to do to get them back. Patient made threatening statement when she was first evited that she just want to die.  However there was no intent or plan.  Today she denies SI/HI or AVH.  How Long Has This Been Causing You Problems? 1-6 months  Have You Recently Had Any Thoughts About Hurting Yourself? No (Made comments back in 09-04-2023 that she wished to be dead.  No plan or intent.)  Are You Planning to Commit Suicide/Harm Yourself At This time? No  Have you Recently Had Thoughts About [place]? No  Are You Planning To Harm Someone At This Time? No  Are you currently experiencing any auditory, visual or other hallucinations? No  Have You Used Any Alcohol or Drugs in the Past 24 Hours? No  Do you have any current medical co-morbidities that require immediate attention? No  Clinician description of  patient physical appearance/behavior: Patient was casually dressed. She was tearful as she talked about her situation.  Patient was alert & oriented.  What Do You Feel Would Help You the Most Today? Housing Assistance;Treatment for Depression or other mood problem;Medication(s)  If access to [facility]gent [facility] was not available, would you have sought care in the Emergency Department? Yes  Determination of Need Routine (7 days)  Options For Referral Outpatient Therapy;Medication Management

## 2022-10-07 NOTE — ED Provider Notes (Signed)
Behavioral Health Urgent Care Medical Screening Exam  Patient Name: [name] MRN: [id number] Date of Evaluation: 10/07/22 Chief Complaint:   Diagnosis:  Final diagnoses:  Adjustment disorder with depressed mood    History of Present illness: [name] is a 47 y.o. female patient with no significant past psychiatric history who presents to the [facility] behavioral health urgent care voluntary accompanied by hier stepfather with complaints of depressive symptoms.  Patient seen and evaluated face-to-face by this provider, chart reviewed and case discussed with Dr. Lovette Cliche. On evaluation, patient is alert and oriented x 4. Her thought process is linear and goal oriented. Her speech is clear and coherent at a moderate tone. Her mood is dysphoric and affect is congruent. She has fair eye contact. She is calm and cooperative. She states that she has been "spiraling" since October when she was informed that she would be evicted from her apartment. She states that she was eventually evicted on August 01, 2022 and had nowhere to go. She states that the sheriff gave her 15 minutes to quickly pack up and get in her car. She reported having thoughts that she would be better off dead in 09-05-23 after she was evicted. She denies suicidal ideations. She denies homicidal ideations. She denies past suicide attempts. She states that she has been staying with various family members. She states that her 2 youngest kids 72 and 69 years old are currently staying with their father. She states that her adult daughter is with her fianc and her 60 year old son lives with his grandparents. She states that she has a fianc that has not been supportive and he has been lying about money and not keeping promises. She states that she is considering leaving him. She states that she receives child support but has limited money and cannot afford groceries. She describes her symptoms as feeling alone, not  having anyone, worrying about her kids, and losing hope. She denies auditory or visual hallucinations. There is no objective evidence that the patient is currently responding to internal or external stimuli.She states that she is unemployed. She denies a past psychiatric history. She denies drinking alcohol or using illicit drugs.    [facility] ED from 10/07/2022 in [facility] ED from 10/06/2022 in [facility] DEPT ED from 03/01/2022 in [facility] No Risk No Risk No Risk       Psychiatric Specialty Exam  Presentation  General Appearance:Appropriate for Environment  Eye Contact:Fair  Speech:Clear and Coherent  Speech Volume:Normal   Mood and Affect  Mood: Depressed  Affect: Congruent   Thought Process  Thought Processes: Coherent  Descriptions of Associations:Intact  Orientation:Full (Time, Place and Person)  Thought Content:Logical    Hallucinations:None  Ideas of Reference:None  Suicidal Thoughts:No  Homicidal Thoughts:No   Sensorium  Memory: Immediate Fair  Judgment: Fair  Insight: Fair   Community education officer  Concentration: Fair  Attention Span: Fair  Recall: [employer] of Knowledge: Fair  Language: Fair   Psychomotor Activity  Psychomotor Activity: Normal   Assets  Assets: Armed forces logistics/support/administrative officer; Desire for Improvement; Leisure Time; Physical Health   Sleep  Sleep: Fair  Number of hours: No data recorded  No data recorded  Physical Exam: Physical Exam HENT:     Head: Normocephalic.     Nose: Nose normal.  Eyes:     Conjunctiva/sclera: Conjunctivae normal.  Cardiovascular:     Rate and Rhythm:  Normal rate.  Pulmonary:     Effort: Pulmonary effort is normal.  Musculoskeletal:        General: Normal range of motion.     Cervical back: Normal range of motion.  Neurological:     Mental Status: She is alert  and oriented to person, place, and time.    Review of Systems  Constitutional: Negative.   HENT: Negative.    Eyes: Negative.   Respiratory: Negative.    Cardiovascular: Negative.   Gastrointestinal: Negative.   Genitourinary: Negative.   Musculoskeletal: Negative.   Neurological: Negative.    Blood pressure (!) 141/85, pulse 100, temperature 98.1 F (36.7 C), temperature source Oral, resp. rate 20, SpO2 100 %. There is no height or weight on file to calculate BMI.  Musculoskeletal: Strength & Muscle Tone: within normal limits Gait & Station: normal Patient leans: N/A   [facility] Discharge Disposition for Follow up and Recommendations: Based on my evaluation the patient does not appear to have an emergency medical condition and can be discharged with resources and follow up care in outpatient services for Medication Management and Individual Therapy  Discharge recommendations:   Medications: Patient is to take medications as prescribed. The patient or patient's guardian is to contact a medical professional and/or outpatient provider to address any new side effects that develop. The patient or the patient's guardian should update outpatient providers of any new medications and/or medication changes.   Outpatient Follow up: Please review list of outpatient resources for psychiatry and counseling. Please follow up with your primary care provider for all medical related needs.   You are encouraged to follow up with [facility] for outpatient treatment.  Walk in/ Open Access Hours: Monday - Friday 8AM - 11AM (To see provider and therapist) Friday - [place] (To see therapist only)  [facility], [place]  Therapy: We recommend that patient participate in individual therapy to address mental health concerns.  Safety:   The following safety precautions should be taken:   No sharp objects. This includes  scissors, razors, scrapers, and putty knives.   Chemicals should be removed and locked up.   Medications should be removed and locked up.   Weapons should be removed and locked up. This includes firearms, knives and instruments that can be used to cause injury.   The patient should abstain from use of illicit substances/drugs and abuse of any medications.  If symptoms worsen or do not continue to improve or if the patient becomes actively suicidal or homicidal then it is recommended that the patient return to the closest hospital emergency department, the [facility], or call 911 for further evaluation and treatment. National Suicide Prevention Lifeline 1-800-SUICIDE or 902-763-7103.  About 988 988 offers 24/7 access to trained crisis counselors who can help people experiencing mental health-related distress. People can call or text 988 or chat 988lifeline.org for themselves or if they are worried about a loved one who may need crisis support.   Marissa Calamity, NP 10/07/2022, 2:49 PM

## 2022-10-07 NOTE — Discharge Instructions (Addendum)
Discharge recommendations:   Medications: Patient is to take medications as prescribed. The patient or patient's guardian is to contact a medical professional and/or outpatient provider to address any new side effects that develop. The patient or the patient's guardian should update outpatient providers of any new medications and/or medication changes.   Outpatient Follow up: Please review list of outpatient resources for psychiatry and counseling. Please follow up with your primary care provider for all medical related needs.   You are encouraged to follow up with [facility] for outpatient treatment.  Walk in/ Open Access Hours: Monday - Friday 8AM - 11AM (To see provider and therapist) Friday - [place] (To see therapist only)  [facility] [place], [place]  Therapy: We recommend that patient participate in individual therapy to address mental health concerns.  Safety:   The following safety precautions should be taken:   No sharp objects. This includes scissors, razors, scrapers, and putty knives.   Chemicals should be removed and locked up.   Medications should be removed and locked up.   Weapons should be removed and locked up. This includes firearms, knives and instruments that can be used to cause injury.   The patient should abstain from use of illicit substances/drugs and abuse of any medications.  If symptoms worsen or do not continue to improve or if the patient becomes actively suicidal or homicidal then it is recommended that the patient return to the closest hospital emergency department, the [facility], or call 911 for further evaluation and treatment. National Suicide Prevention Lifeline 1-800-SUICIDE or 740-169-0285.  About 988 988 offers 24/7 access to trained crisis counselors who can help people experiencing mental health-related distress. People can call or  text 988 or chat 988lifeline.org for themselves or if they are worried about a loved one who may need crisis support.

## 2022-10-10 ENCOUNTER — Ambulatory Visit (INDEPENDENT_AMBULATORY_CARE_PROVIDER_SITE_OTHER): Payer: Medicaid Other | Admitting: Physician Assistant

## 2022-10-10 ENCOUNTER — Encounter (HOSPITAL_COMMUNITY): Payer: Self-pay | Admitting: Physician Assistant

## 2022-10-10 DIAGNOSIS — F411 Generalized anxiety disorder: Secondary | ICD-10-CM

## 2022-10-10 DIAGNOSIS — F4321 Adjustment disorder with depressed mood: Secondary | ICD-10-CM | POA: Diagnosis not present

## 2022-10-10 DIAGNOSIS — G479 Sleep disorder, unspecified: Secondary | ICD-10-CM | POA: Diagnosis not present

## 2022-10-10 MED ORDER — SERTRALINE HCL 50 MG PO TABS: 50.0000 mg | ORAL_TABLET | Freq: Every day | ORAL | 1 refills | Status: DC

## 2022-10-10 MED ORDER — TRAZODONE HCL 50 MG PO TABS: 50.0000 mg | ORAL_TABLET | Freq: Every day | ORAL | 1 refills | Status: DC

## 2022-10-10 MED ORDER — HYDROXYZINE HCL 10 MG PO TABS: 10.0000 mg | ORAL_TABLET | Freq: Three times a day (TID) | ORAL | 1 refills | Status: DC | PRN
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
## 2022-11-02 ENCOUNTER — Other Ambulatory Visit (HOSPITAL_COMMUNITY): Payer: Self-pay | Admitting: Physician Assistant

## 2022-11-02 DIAGNOSIS — F4321 Adjustment disorder with depressed mood: Secondary | ICD-10-CM

## 2022-11-02 DIAGNOSIS — G479 Sleep disorder, unspecified: Secondary | ICD-10-CM

## 2022-11-02 DIAGNOSIS — F411 Generalized anxiety disorder: Secondary | ICD-10-CM

## 2022-11-06 ENCOUNTER — Ambulatory Visit (INDEPENDENT_AMBULATORY_CARE_PROVIDER_SITE_OTHER): Payer: Medicaid Other | Admitting: Clinical

## 2022-11-06 DIAGNOSIS — F331 Major depressive disorder, recurrent, moderate: Secondary | ICD-10-CM
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
## 2022-11-21 ENCOUNTER — Encounter (HOSPITAL_COMMUNITY): Payer: Medicaid Other | Admitting: Physician Assistant

## 2022-12-10 ENCOUNTER — Ambulatory Visit (HOSPITAL_COMMUNITY): Payer: Medicaid Other | Admitting: Clinical

## 2022-12-24 ENCOUNTER — Ambulatory Visit (HOSPITAL_COMMUNITY): Payer: Medicaid Other | Admitting: Clinical

## 2022-12-30 ENCOUNTER — Ambulatory Visit (HOSPITAL_COMMUNITY)
Admission: EM | Admit: 2022-12-30 | Discharge: 2022-12-30 | Disposition: A | Discharge status: Home / Self Care | Payer: Medicaid Other | Attending: Family Medicine | Admitting: Family Medicine

## 2022-12-30 DIAGNOSIS — Z5901 Sheltered homelessness: Secondary | ICD-10-CM | POA: Insufficient documentation

## 2022-12-30 DIAGNOSIS — Z9151 Personal history of suicidal behavior: Secondary | ICD-10-CM | POA: Insufficient documentation

## 2022-12-30 DIAGNOSIS — R45851 Suicidal ideations: Secondary | ICD-10-CM | POA: Insufficient documentation

## 2022-12-30 DIAGNOSIS — Z560 Unemployment, unspecified: Secondary | ICD-10-CM | POA: Insufficient documentation

## 2022-12-30 DIAGNOSIS — F4321 Adjustment disorder with depressed mood: Secondary | ICD-10-CM | POA: Insufficient documentation

## 2022-12-30 DIAGNOSIS — Z789 Other specified health status: Secondary | ICD-10-CM

## 2022-12-30 DIAGNOSIS — Z008 Encounter for other general examination: Secondary | ICD-10-CM | POA: Insufficient documentation

## 2022-12-30 DIAGNOSIS — Z5900 Homelessness unspecified: Secondary | ICD-10-CM

## 2022-12-30 NOTE — Discharge Instructions (Addendum)
Follow-up with outpatient mental health provider. [facility] has services in place that can assist with obtaining medications if you are unable to afford them.  As discussed follow-up at [facility] for assistance with employment placement.

## 2022-12-30 NOTE — ED Provider Notes (Signed)
Behavioral Health Urgent Care Medical Screening Exam  Patient Name: [name] MRN: [id number] Date of Evaluation: 12/30/22 Chief Complaint:  "I was suicidal last night from staying at the [facility]" Diagnosis:  Final diagnoses:  Adjustment disorder with depressed mood  Passive suicidal ideations  Need for community resource  Homelessness    [name] 47 y.o., female patient presented to [facility] as a walk in voluntarily unaccompanied with complaints of being homeless and living at the [facility] is causing her to have suicidal ideations.  [name], 47 y.o., female patient seen face to face by this provider, consulted with Dr. Lucianne Muss; and chart reviewed on 12/30/22.  On evaluation [name] reports that she is currently homeless and has been living at the [facility] shelter for over one week. Reports ongoing family conflict with mother and stepfather who put her out their home after stayed there for 2 days. She reports her homeless situation occurred back in November when she was evicted from home in which she shared with her boyfriend. She reports her boyfriend is a Naval architect and continues to tell her he is going to pick her up, however, she continues to remain at the shelter. 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 She denies any recent attempts to obtain employment and reports that no one at the [facility] has attempted to help her with resources. Patient is established with [facility] outpatient psychiatric services and was last seen February 2024 prescribed antidepressants and reports not starting medications as she was unable to pay co-payment. Patient has no prior psychiatric history prior to January 2024 and reports that she's never been psychiatrically admitted previously. Reports a prior suicide attempt 15 years ago which she kept the suicide attempt to herself and did not tell anyone. Denies any specific plan for  suicide although endorses if she thinks about it hard she could figure out a plan for suicide. She continues to report her dissatisfaction with the living conditions at the shelter such as being required to sleep on the floor, not having pillows or clothing. She reports that her family talks with her but no one will help her with housing.   During evaluation [name] is sitting in chair in no acute distress.  She is alert, oriented x 4, calm, cooperative and attentive.  Her mood is dysphoric, with congruent affect.  She has normal speech, and behavior.  Objectively there is no evidence of psychosis/mania or delusional thinking.  Patient is able to converse coherently, goal directed thoughts, no distractibility, or pre-occupation. She also denies passive suicidal ideations which thoughts of suicide are centered around ability to remain at facility for sheltering purposes. Denies HI or self harming behaviors. Patient doesn't meet criteria of inpatient psychiatric treatment and will be discharged with appropriate community resources.    Flowsheet Row Counselor from 11/06/2022 in [facility] Office Visit from 10/10/2022 in [facility] ED from 10/07/2022 in [facility]  C-SSRS RISK CATEGORY No Risk Low Risk No Risk       Psychiatric Specialty Exam  Presentation  General Appearance:Fairly Groomed  Eye Contact:Fair  Speech:Clear and Coherent  Speech Volume:Normal  Handedness:Right   Mood and Affect  Mood: Dysphoric  Affect: Appropriate   Thought Process  Thought Processes: Coherent  Descriptions of Associations:Intact  Orientation:Full (Time, Place and Person)  Thought Content:Logical  Diagnosis of Schizophrenia or  Schizoaffective disorder in past: No   Hallucinations:None  Ideas of Reference:None  Suicidal Thoughts:Yes, Passive (Conditional SI if she has to remain at the  [facility]) Without Plan  Homicidal Thoughts:No   Sensorium  Memory: Immediate Good; Remote Good  Judgment: Fair  Insight: Fair   Art therapist  Concentration: Good  Attention Span: Good  Recall: Good  Fund of Knowledge: Good  Language: Good   Psychomotor Activity  Psychomotor Activity: Normal   Assets  Assets: Communication Skills; Physical Health; Vocational/Educational   Sleep  Sleep: Fair  Number of hours: No data recorded  Physical Exam: Physical Exam Vitals reviewed.  Constitutional:      Comments: Fairly groomed  HENT:     Head: Normocephalic.  Eyes:     Extraocular Movements: Extraocular movements intact.     Pupils: Pupils are equal, round, and reactive to light.  Cardiovascular:     Rate and Rhythm: Regular rhythm. Tachycardia present.  Pulmonary:     Effort: Pulmonary effort is normal.  Musculoskeletal:        General: Normal range of motion.     Cervical back: Normal range of motion.  Neurological:     General: No focal deficit present.     Mental Status: She is alert.    Review of Systems  Psychiatric/Behavioral:  Positive for depression and suicidal ideas. The patient has insomnia.        Passive suicidal ideation triggered by current state of homeless     Blood pressure (!) 143/84, pulse (!) 135, temperature 97.8 F (36.6 C), temperature source Oral, resp. rate 19, SpO2 99 %. There is no height or weight on file to calculate BMI. Pulse rechecked : 108 (BPM)  Musculoskeletal: Strength & Muscle Tone: within normal limits Gait & Station: normal Patient leans: N/A   [facility] MSE Discharge Disposition for Follow up and Recommendations: Based on my evaluation the patient does not appear to have an emergency medical condition and can be discharged with resources and follow up care in outpatient services for shelter resources, one pair of women's clothing provided, nourishment provided, bus pass, and job placement resources  given at discharge. Patient complaints of SI are consistent with efforts of attempting to obtain secondary gain of shelter by reports conditional suicidal ideations contingent on her ability to remain at this facility as a means for obtaining temporary shelter. Patient has established outpatient mental health resources in place, advised to call and schedule follow-up. Patient verbalized understanding and acknowledgment of plan. Return precautions given.    Joaquin Courts, FNP-C, PMHNP-BC  Behavioral Health Service Line  [facility]gent  [phone] 12/30/2022, 1:11 PM

## 2022-12-30 NOTE — Progress Notes (Signed)
Patient is a 47 year old female who presents voluntarily to [facility] due to feeling as she is recently being asked to leave her mother's home. Patient was evicted from her home in November and has not had stable housing since. Patient has been living with her mother and stepfather since January, but they recently asked her to leave.  She has been staying at the [facility] hoping her significant other would come and get.  Patient 's SI is circumstantial. Patient denies HI/or symptoms of psychosis.  Pt. Was casually dressed and tearful.  Pt denied any drug or alcohol use.    12/30/22 1219  [facility] Triage Screening (Walk-ins at [facility] only)  What Is the Reason for Your Visit/Call Today? Patient is a 47 year old female who presents voluntarily to [facility] due to feeling as she is recently being asked to leave her mother's home. Patient was evicted from her home in November and has not had stable housing since. Patient has been living with her mother and stepfather since January, but they recently asked her to leave.  She has been staying at the [facility] hoping her significant other would come and get.  Patient 's SI is circumstantial. Patient denies HI/or symptoms of psychosis.  Pt. Was casually dressed and tearful.  Pt denied any drug or alcohol use.  How Long Has This Been Causing You Problems? 1-6 months  Have You Recently Had Any Thoughts About Hurting Yourself? Yes  How long ago did you have thoughts about hurting yourself? last night  Are You Planning to Commit Suicide/Harm Yourself At This time? No  Have you Recently Had Thoughts About Hurting Someone Karolee Ohs? No  Are You Planning To Harm Someone At This Time? No  Are you currently experiencing any auditory, visual or other hallucinations? No  Have You Used Any Alcohol or Drugs in the Past 24 Hours? No  Do you have any current medical co-morbidities that require immediate attention? No  Clinician description of patient physical appearance/behavior: Patient was casually dressed  , alert & oriented.  She was tearful as she talked about her situation  What Do You Feel Would Help You the Most Today? Housing Assistance;Treatment for Depression or other mood problem;Medication(s);Social Support  If access to [facility] was not available, would you have sought care in the Emergency Department? No  Determination of Need Routine (7 days)  Options For Referral Medication Management;Outpatient Therapy

## 2023-04-25 DEATH — deceased
